# Patient Record
Sex: Male | Born: 1950
Health system: Southern US, Community
[De-identification: ages and names within clinical notes are randomized; demographics above are authoritative.]

## PROBLEM LIST (undated history)

## (undated) DIAGNOSIS — I480 Paroxysmal atrial fibrillation: Secondary | ICD-10-CM

## (undated) DIAGNOSIS — E785 Hyperlipidemia, unspecified: Secondary | ICD-10-CM

## (undated) DIAGNOSIS — I10 Essential (primary) hypertension: Secondary | ICD-10-CM

## (undated) DIAGNOSIS — I639 Cerebral infarction, unspecified: Secondary | ICD-10-CM

## (undated) DIAGNOSIS — E039 Hypothyroidism, unspecified: Secondary | ICD-10-CM

## (undated) HISTORY — PX: EYE SURGERY: SHX253

---

## 2015-11-14 ENCOUNTER — Encounter (HOSPITAL_BASED_OUTPATIENT_CLINIC_OR_DEPARTMENT_OTHER): Payer: Self-pay | Admitting: *Deleted

## 2015-11-14 ENCOUNTER — Inpatient Hospital Stay (HOSPITAL_BASED_OUTPATIENT_CLINIC_OR_DEPARTMENT_OTHER)
Admission: EM | Admit: 2015-11-14 | Discharge: 2015-11-17 | DRG: 065 | Disposition: A | Discharge status: Home / Self Care | Payer: Medicare Other | Attending: Internal Medicine | Admitting: Internal Medicine

## 2015-11-14 ENCOUNTER — Emergency Department (HOSPITAL_BASED_OUTPATIENT_CLINIC_OR_DEPARTMENT_OTHER): Payer: Medicare Other

## 2015-11-14 ENCOUNTER — Inpatient Hospital Stay (HOSPITAL_COMMUNITY): Payer: Medicare Other

## 2015-11-14 DIAGNOSIS — E02 Subclinical iodine-deficiency hypothyroidism: Secondary | ICD-10-CM | POA: Diagnosis present

## 2015-11-14 DIAGNOSIS — I480 Paroxysmal atrial fibrillation: Secondary | ICD-10-CM | POA: Diagnosis present

## 2015-11-14 DIAGNOSIS — Z8241 Family history of sudden cardiac death: Secondary | ICD-10-CM | POA: Diagnosis not present

## 2015-11-14 DIAGNOSIS — Z800 Family history of malignant neoplasm of digestive organs: Secondary | ICD-10-CM

## 2015-11-14 DIAGNOSIS — I672 Cerebral atherosclerosis: Secondary | ICD-10-CM | POA: Diagnosis present

## 2015-11-14 DIAGNOSIS — E782 Mixed hyperlipidemia: Secondary | ICD-10-CM | POA: Diagnosis not present

## 2015-11-14 DIAGNOSIS — K449 Diaphragmatic hernia without obstruction or gangrene: Secondary | ICD-10-CM | POA: Diagnosis present

## 2015-11-14 DIAGNOSIS — Z23 Encounter for immunization: Secondary | ICD-10-CM | POA: Diagnosis not present

## 2015-11-14 DIAGNOSIS — Z79899 Other long term (current) drug therapy: Secondary | ICD-10-CM | POA: Diagnosis not present

## 2015-11-14 DIAGNOSIS — Z8249 Family history of ischemic heart disease and other diseases of the circulatory system: Secondary | ICD-10-CM

## 2015-11-14 DIAGNOSIS — I6340 Cerebral infarction due to embolism of unspecified cerebral artery: Secondary | ICD-10-CM | POA: Diagnosis not present

## 2015-11-14 DIAGNOSIS — R29702 NIHSS score 2: Secondary | ICD-10-CM | POA: Diagnosis present

## 2015-11-14 DIAGNOSIS — G8194 Hemiplegia, unspecified affecting left nondominant side: Secondary | ICD-10-CM | POA: Diagnosis present

## 2015-11-14 DIAGNOSIS — I639 Cerebral infarction, unspecified: Secondary | ICD-10-CM

## 2015-11-14 DIAGNOSIS — R29810 Facial weakness: Secondary | ICD-10-CM | POA: Diagnosis present

## 2015-11-14 DIAGNOSIS — R269 Unspecified abnormalities of gait and mobility: Secondary | ICD-10-CM | POA: Diagnosis not present

## 2015-11-14 DIAGNOSIS — I6789 Other cerebrovascular disease: Secondary | ICD-10-CM | POA: Diagnosis not present

## 2015-11-14 DIAGNOSIS — I10 Essential (primary) hypertension: Secondary | ICD-10-CM | POA: Diagnosis present

## 2015-11-14 DIAGNOSIS — E785 Hyperlipidemia, unspecified: Secondary | ICD-10-CM | POA: Diagnosis not present

## 2015-11-14 DIAGNOSIS — Z8673 Personal history of transient ischemic attack (TIA), and cerebral infarction without residual deficits: Secondary | ICD-10-CM | POA: Diagnosis present

## 2015-11-14 DIAGNOSIS — I4891 Unspecified atrial fibrillation: Secondary | ICD-10-CM | POA: Diagnosis not present

## 2015-11-14 DIAGNOSIS — R471 Dysarthria and anarthria: Secondary | ICD-10-CM | POA: Diagnosis present

## 2015-11-14 HISTORY — DX: Paroxysmal atrial fibrillation: I48.0

## 2015-11-14 HISTORY — DX: Hyperlipidemia, unspecified: E78.5

## 2015-11-14 HISTORY — DX: Essential (primary) hypertension: I10

## 2015-11-14 HISTORY — DX: Cerebral infarction, unspecified: I63.9

## 2015-11-14 LAB — COMPREHENSIVE METABOLIC PANEL
ALT: 31 U/L (ref 17–63)
ANION GAP: 10 (ref 5–15)
AST: 27 U/L (ref 15–41)
Albumin: 5 g/dL (ref 3.5–5.0)
Alkaline Phosphatase: 60 U/L (ref 38–126)
BILIRUBIN TOTAL: 1.4 mg/dL — AB (ref 0.3–1.2)
BUN: 19 mg/dL (ref 6–20)
CO2: 24 mmol/L (ref 22–32)
Calcium: 9.6 mg/dL (ref 8.9–10.3)
Chloride: 105 mmol/L (ref 101–111)
Creatinine, Ser: 1.08 mg/dL (ref 0.61–1.24)
Glucose, Bld: 133 mg/dL — ABNORMAL HIGH (ref 65–99)
POTASSIUM: 3.9 mmol/L (ref 3.5–5.1)
Sodium: 139 mmol/L (ref 135–145)
TOTAL PROTEIN: 8.4 g/dL — AB (ref 6.5–8.1)

## 2015-11-14 LAB — CBC
HEMATOCRIT: 51.3 % (ref 39.0–52.0)
Hemoglobin: 17.8 g/dL — ABNORMAL HIGH (ref 13.0–17.0)
MCH: 30.2 pg (ref 26.0–34.0)
MCHC: 34.7 g/dL (ref 30.0–36.0)
MCV: 86.9 fL (ref 78.0–100.0)
Platelets: 201 10*3/uL (ref 150–400)
RBC: 5.9 MIL/uL — ABNORMAL HIGH (ref 4.22–5.81)
RDW: 13.6 % (ref 11.5–15.5)
WBC: 10.9 10*3/uL — ABNORMAL HIGH (ref 4.0–10.5)

## 2015-11-14 LAB — GLUCOSE, CAPILLARY: Glucose-Capillary: 161 mg/dL — ABNORMAL HIGH (ref 65–99)

## 2015-11-14 LAB — TROPONIN I

## 2015-11-14 MED ORDER — DILTIAZEM HCL-DEXTROSE 100-5 MG/100ML-% IV SOLN (PREMIX)
INTRAVENOUS | Status: AC
  Administered 2015-11-14: 10 mg via INTRAVENOUS
  Filled 2015-11-14: qty 100

## 2015-11-14 MED ORDER — DILTIAZEM HCL 30 MG PO TABS
30.0000 mg | ORAL_TABLET | Freq: Once | ORAL | Status: AC
  Administered 2015-11-14: 30 mg via ORAL
  Filled 2015-11-14: qty 1

## 2015-11-14 MED ORDER — ASPIRIN 81 MG PO CHEW
324.0000 mg | CHEWABLE_TABLET | Freq: Once | ORAL | Status: AC
  Administered 2015-11-14: 324 mg via ORAL
  Filled 2015-11-14: qty 4

## 2015-11-14 MED ORDER — ASPIRIN 300 MG RE SUPP: 300.0000 mg | Freq: Every day | RECTAL | Status: DC

## 2015-11-14 MED ORDER — DILTIAZEM HCL 25 MG/5ML IV SOLN
15.0000 mg | Freq: Once | INTRAVENOUS | Status: AC
  Administered 2015-11-14: 15 mg via INTRAVENOUS
  Filled 2015-11-14: qty 5

## 2015-11-14 MED ORDER — STROKE: EARLY STAGES OF RECOVERY BOOK
Freq: Once | Status: AC
  Administered 2015-11-14: 21:00:00
  Filled 2015-11-14: qty 1

## 2015-11-14 MED ORDER — HYDRALAZINE HCL 20 MG/ML IJ SOLN
5.0000 mg | Freq: Once | INTRAMUSCULAR | Status: AC
  Administered 2015-11-14: 5 mg via INTRAVENOUS
  Filled 2015-11-14: qty 1

## 2015-11-14 MED ORDER — ENOXAPARIN SODIUM 40 MG/0.4ML ~~LOC~~ SOLN
40.0000 mg | SUBCUTANEOUS | Status: DC
  Administered 2015-11-14: 40 mg via SUBCUTANEOUS
  Filled 2015-11-14: qty 0.4

## 2015-11-14 MED ORDER — DILTIAZEM LOAD VIA INFUSION
10.0000 mg | Freq: Once | INTRAVENOUS | Status: AC
  Administered 2015-11-14: 10 mg via INTRAVENOUS
  Filled 2015-11-14: qty 10

## 2015-11-14 MED ORDER — DILTIAZEM HCL 100 MG IV SOLR
5.0000 mg/h | INTRAVENOUS | Status: DC
  Administered 2015-11-14: 5 mg/h via INTRAVENOUS

## 2015-11-14 MED ORDER — DILTIAZEM HCL 25 MG/5ML IV SOLN
10.0000 mg | Freq: Once | INTRAVENOUS | Status: AC
  Administered 2015-11-14: 10 mg via INTRAVENOUS
  Filled 2015-11-14: qty 5

## 2015-11-14 MED ORDER — METOPROLOL TARTRATE 25 MG PO TABS
25.0000 mg | ORAL_TABLET | Freq: Two times a day (BID) | ORAL | Status: DC
  Administered 2015-11-14: 25 mg via ORAL
  Filled 2015-11-14: qty 1

## 2015-11-14 MED ORDER — ASPIRIN 325 MG PO TABS
325.0000 mg | ORAL_TABLET | Freq: Every day | ORAL | Status: DC
  Administered 2015-11-15: 325 mg via ORAL
  Filled 2015-11-14: qty 1

## 2015-11-14 MED ORDER — METOPROLOL TARTRATE 5 MG/5ML IV SOLN
2.5000 mg | INTRAVENOUS | Status: AC
  Filled 2015-11-14: qty 5

## 2015-11-14 MED ORDER — SENNOSIDES-DOCUSATE SODIUM 8.6-50 MG PO TABS: 1.0000 | ORAL_TABLET | Freq: Every evening | ORAL | Status: DC | PRN

## 2015-11-14 NOTE — ED Notes (Signed)
Per MD discontinue cardizem

## 2015-11-14 NOTE — Progress Notes (Signed)
Patient arrived to 5M18 AAOx4. Tele placed and vitals taken. MD in the room. Will continue to monitor. Cassidie Veiga, Rande Brunt, RN

## 2015-11-14 NOTE — ED Notes (Signed)
Attempted to call report to floor-unable to take call at present.  States they will call back

## 2015-11-14 NOTE — ED Notes (Signed)
MD at bedside. 

## 2015-11-14 NOTE — ED Notes (Signed)
Patient noted to be back in a. Fib with a rate of 120-150.  Dr. Billy Fischer made aware.

## 2015-11-14 NOTE — ED Triage Notes (Addendum)
Pt c/o left sided weakness, left side facial droop, dizziness and unsteady gait x 4 days.

## 2015-11-14 NOTE — ED Notes (Signed)
Spoke with Robin in bed control. States pt is waiting for neuro-tele bed to become available but should be placed this afternoon

## 2015-11-14 NOTE — Consult Note (Signed)
Neurology Consultation Reason for Consult: Left-sided weakness Referring Physician: Loleta Books, C  CC: Left-sided weakness  History is obtained from: Patient  HPI: Christian Jennings is a 65 y.o. male history of left-sided weakness that isn't present for 4 days. He states that he was playing tennis, and went to use his left hand to throw up a serve and found that it wasn't working quite right. He then noticed over the course of the past few days that things still seemed to be not quite right including with minimum year that he may be his left face isn't quite normal. He therefore presented to [facility] today. While at [facility] [place], he was found to be in atrial fibrillation with rapid ventricular response, and appears to have flipped in and out. He does not notice his heart racing or any other symptoms with this, so it is unclear how long he has had it.     ROS: A 14 point ROS was performed and is negative except as noted in the HPI.   History reviewed. No pertinent past medical history.   Family History  Problem Relation Age of Onset  . Transient ischemic attack Mother   . Alcoholism Father   . COPD Father   . Throat cancer Brother     Or esophageal     Social History:  reports that he has never smoked. He has never used smokeless tobacco. He reports that he does not drink alcohol. His drug history is not on file.   Exam: Current vital signs: BP (!) 142/99 (BP Location: Right Arm)   Pulse 78   Temp 97.8 F (36.6 C) (Oral)   Resp 18   Ht 5\' 5"  (1.651 m)   Wt 64.4 kg (142 lb)   SpO2 98%   BMI 23.63 kg/m  Vital signs in last 24 hours: Temp:  [97.8 F (36.6 C)-98.2 F (36.8 C)] 97.8 F (36.6 C) (09/29 2230) Pulse Rate:  [41-157] 78 (09/29 2230) Resp:  [13-22] 18 (09/29 2230) BP: (142-202)/(72-143) 142/99 (09/29 2230) SpO2:  [95 %-100 %] 98 % (09/29 2230) Weight:  [64.4 kg (142 lb)] 64.4 kg (142 lb) (09/29 1016)   Physical Exam  Constitutional:  Appears well-developed and well-nourished.  Psych: Affect appropriate to situation Eyes: No scleral injection HENT: No OP obstrucion Head: Normocephalic.  Cardiovascular: Normal rate and regular rhythm.  Respiratory: Effort normal and breath sounds normal to anterior ascultation GI: Soft.  No distension. There is no tenderness.  Skin: WDI  Neuro: Mental Status: Patient is awake, alert, oriented to person, place, month, year, and situation. Patient is able to give a clear and coherent history. No signs of aphasia or neglect Cranial Nerves: II: Visual Fields are full. Pupils are equal, round, and reactive to light.   III,IV, VI: EOMI without diploplia.  V: Facial sensation is symmetric to temperature VII: Facial movement is noticeable for mild left-sided weakness VIII: hearing is intact to voice X: Uvula elevates symmetrically XI: Shoulder shrug is symmetric. XII: tongue is midline without atrophy or fasciculations.  Motor: Tone is normal. Bulk is normal. 5/5 strength was present on the right, he has 4/5 strength in his left arm and 4+/5 strength in his left leg. Sensory: Sensation is symmetric to light touch and temperature in the arms and legs. Cerebellar: FNF and HKS are intact on the right, consistent with weakness on the left  I have reviewed labs in epic and the results pertinent to this consultation are: CMP-unremarkable  I have reviewed the images obtained: CT head-unremarkable  Impression: 65 year old male with acute stroke, likely due to atrial fibrillation. I suspect it is going to be small since it is not clearly well seen on CT despite being 60 days old. He being 34 days old. He will need further workup in physical therapy.  Recommendations: 1. HgbA1c, fasting lipid panel 2. MRI, MRA  of the brain without contrast 3. Frequent neuro checks 4. Echocardiogram 5. Carotid dopplers 6. Prophylactic therapy-Antiplatelet med: Aspirin - dose 325mg  PO or 300mg  PR 7. Risk  factor modification 8. Telemetry monitoring 9. PT consult, OT consult, Speech consult 10. please page stroke NP  Or  PA  Or MD  from 8am -4 pm starting 9/30 as this patient will be followed by the stroke team at this point.   You can look them up on [email or web address]      Roland Rack, MD [facility] [phone]  If 7pm- 7am, please page neurology on call as listed in [place].

## 2015-11-14 NOTE — ED Notes (Signed)
Called floor again-Report given to [place], Therapist, sports.

## 2015-11-14 NOTE — ED Notes (Signed)
Attempted to call floor again for report-states nurse unable to take report.  Informed floor that carelink is en route.  Verbalized understanding.  States they will call back.

## 2015-11-14 NOTE — ED Provider Notes (Addendum)
[facility] Provider Note   CSN: [id number] Arrival date & time: 11/14/15  1006     History   Chief Complaint Chief Complaint  Patient presents with  . Weakness    HPI Christian Jennings is a 65 y.o. male.  Patient is a 65 year old male with no past medical history and reports not having seen a physician in nearly 20 years. He presents today for evaluation of left facial droop and left arm and leg weakness for the past 3 days. He reports trouble with coordination and feeling off balance. He denies any headache or visual disturbances. He denies any chest pain or palpitations. He denies any shortness of breath. There are no aggravating or alleviating factors.      History reviewed. No pertinent past medical history.  There are no active problems to display for this patient.   Past Surgical History:  Procedure Laterality Date  . EYE SURGERY         Home Medications    Prior to Admission medications   Medication Sig Start Date End Date Taking? Authorizing Provider  NATTOKINASE PO Take by mouth.   Yes Historical Provider, MD    Family History History reviewed. No pertinent family history.  Social History Social History  Substance Use Topics  . Smoking status: Never Smoker  . Smokeless tobacco: Not on file  . Alcohol use No     Allergies   Review of patient's allergies indicates no known allergies.   Review of Systems Review of Systems  All other systems reviewed and are negative.    Physical Exam Updated Vital Signs BP 150/74   Pulse 84   Temp 98.2 F (36.8 C) (Oral)   Resp 18   Ht 5\' 5"  (1.651 m)   Wt 142 lb (64.4 kg)   SpO2 100%   BMI 23.63 kg/m   Physical Exam  Constitutional: He is oriented to person, place, and time. He appears well-developed and well-nourished. No distress.  HENT:  Head: Normocephalic and atraumatic.  Mouth/Throat: Oropharynx is clear and moist.  Eyes: EOM are normal. Pupils are equal, round, and reactive  to light.  Neck: Normal range of motion. Neck supple.  Cardiovascular: Exam reveals no friction rub.   No murmur heard. Heart is irregularly irregular and rapid.  Pulmonary/Chest: Effort normal and breath sounds normal. No respiratory distress. He has no wheezes. He has no rales.  Abdominal: Soft. Bowel sounds are normal. He exhibits no distension. There is no tenderness.  Musculoskeletal: Normal range of motion. He exhibits no edema.  Neurological: He is alert and oriented to person, place, and time. A cranial nerve deficit is present. Coordination normal.  There is a left-sided facial droop noted. Strength is 5 out of 5 in all extremities.  Skin: Skin is warm and dry. He is not diaphoretic.  Nursing note and vitals reviewed.    ED Treatments / Results  Labs (all labs ordered are listed, but only abnormal results are displayed) Labs Reviewed  TROPONIN I  CBC  COMPREHENSIVE METABOLIC PANEL    EKG  EKG Interpretation  Date/Time:  Friday November 14 2015 10:30:54 EDT Ventricular Rate:  175 PR Interval:    QRS Duration: 92 QT Interval:  284 QTC Calculation: 485 R Axis:   -73 Text Interpretation:  Atrial fibrillation with rapid V-rate Markedly posterior QRS axis Probable LVH with secondary repol abnrm ST depression, probably rate related Confirmed by Danzig Macgregor  MD, Mickell Birdwell ([id number]) on 11/14/2015 10:40:49 AM  Radiology No results found.  Procedures Procedures (including critical care time)  Medications Ordered in ED Medications - No data to display   Initial Impression / Assessment and Plan / ED Course  I have reviewed the triage vital signs and the nursing notes.  Pertinent labs & imaging results that were available during my care of the patient were reviewed by me and considered in my medical decision making (see chart for details).  Clinical Course    Patient is a 65 year old male with no past medical history, and who has not seen a physician in many years. He  presents for evaluation of left facial droop along with weakness and loss of coordination of his left arm and left leg. This started 3 days ago. While in the emergency department, he developed atrial fibrillation with rapid ventricular response with a rate in the 170s. He was started on a Cardizem drip.  CT scan of the head is negative, however I highly suspect that this patient has experienced a stroke. I've discussed the case with Dr. Shon Hale from neurology who is recommending admission to the hospitalist service. Patient accepted in transfer by Dr. Denton Brick. The patient will require further workup including MRI and likely study of his carotids and echocardiogram.  CRITICAL CARE Performed by: Veryl Speak Total critical care time: 45 minutes Critical care time was exclusive of separately billable procedures and treating other patients. Critical care was necessary to treat or prevent imminent or life-threatening deterioration. Critical care was time spent personally by me on the following activities: development of treatment plan with patient and/or surrogate as well as nursing, discussions with consultants, evaluation of patient's response to treatment, examination of patient, obtaining history from patient or surrogate, ordering and performing treatments and interventions, ordering and review of laboratory studies, ordering and review of radiographic studies, pulse oximetry and re-evaluation of patient's condition.   Final Clinical Impressions(s) / ED Diagnoses   Final diagnoses:  None    New Prescriptions New Prescriptions   No medications on file     Veryl Speak, MD 11/15/15 0700    Veryl Speak, MD 11/15/15 0700

## 2015-11-14 NOTE — H&P (Signed)
History and Physical  Patient Name: Christian Jennings     [id number]    DOB: 1950-09-01    DOA: 11/14/2015 PCP: No PCP Per Patient   Patient coming from: Home     Chief Complaint: Left sided weakness  HPI: Christian Jennings is a 65 y.o. male with no known past medical history who presents with left sided weakness.  Patient hasn't seen a physician in over 20 years (the last time was for a URI).  Three days before presentation on Tuesday, the patient was playing tennis, and realized that he couldn't throw the ball up as high as he usually could with his left hand when serving. Nonetheless he finished practicing tennis and went home without further problems.   Wednesday he woke up and noticed some dizziness, similar to previous episodes of vertigo but no chest discomfort, chest pain, shortness of breath, syncope, exertional dyspnea.  He also continued to have left-sided weakness (couldn't shampoo his hair as well with his left hand, left leg felt clumsier).  He spent the day thinking it was a pinched nerve or BPPV and arranged to see a chiropractor next week.  Thursday night last night he just couldn't sleep all night, and finally today the left sided clumsiness still was present so he went to UC who referred him to the ER.  ED course: -Afebrile, heart rate 70s to 170s, blood pressure 160/103, pulse oximetry and respirations normal -Na 139, K 3.9, Cr 1.08 (baseline unknown), WBC 10.9K, Hgb 17.8 -Troponin negative, ECG showed atrial fibrillation with RVR -CT head without contrast unremarkable -He was given diltiazem as a bolus and TRH were asked to evaluate in transfer and Neurology were notified     Review of systems:  Review of Systems  HENT: Negative for tinnitus.   Eyes: Negative for blurred vision and double vision.  Respiratory: Negative for shortness of breath.   Cardiovascular: Positive for palpitations (maybe?  "flutter in my belly sometimes"). Negative for chest pain, orthopnea  and leg swelling.  Musculoskeletal: Negative for back pain and neck pain.  Neurological: Positive for dizziness and focal weakness. Negative for tingling, tremors, sensory change, speech change, seizures, loss of consciousness and headaches.  All other systems reviewed and are negative.        History reviewed. No pertinent past medical history.  Past Surgical History:  Procedure Laterality Date  . EYE SURGERY     As child for muscle issue  . EYE SURGERY     Cosmetic for drooping eyelid    Social History: Patient lives with his wife.  Patient walks unassisted.  He is physically active.  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  He is not a smoker does not drink alcohol.  From [place] originally, lived here for many years.  No Known Allergies  Family history: family history includes Alcoholism in his father; COPD in his father; Throat cancer in his brother; Transient ischemic attack in his mother.   Prior to Admission medications   Medication Sig Start Date End Date Taking? Authorizing Provider  ALPHA LIPOIC ACID PO Take by mouth.   Yes Historical Provider, MD  CINNAMON PO Take by mouth.   Yes Historical Provider, MD  co-enzyme Q-10 30 MG capsule Take 30 mg by mouth 3 (three) times daily.   Yes Historical Provider, MD  L-ARGININE PO Take by mouth.   Yes Historical Provider, MD  Lecithin GRAN by Does not apply  route.   Yes Historical Provider, MD  NATTOKINASE PO Take by mouth.   Yes Historical Provider, MD  omega-3 acid ethyl esters (LOVAZA) 1 g capsule Take by mouth 2 (two) times daily.   Yes Historical Provider, MD  PHYTOSTEROLS PO Take by mouth.   Yes Historical Provider, MD       Physical Exam: BP (!) 176/115 (BP Location: Right Arm)   Pulse (!) 157   Temp 98.2 F (36.8 C)   Resp 15   Ht 5\' 5"  (1.651 m)   Wt 64.4 kg (142 lb)   SpO2 100%   BMI 23.63 kg/m  General appearance: Well-developed, adult male,  alert and in no acute distress.   Eyes: Anicteric, conjunctiva pink, lids and lashes normal. PERRL.   The left eye lid droops, he states this is chronic. ENT: No nasal deformity, discharge, epistaxis.  Hearing normal. OP moist without lesions.   Dentition normal. Lymph: No cervical, supraclavicular or axillary lymphadenopathy. Skin: Warm and dry.  No jaundice.  No suspicious rashes or lesions. Cardiac: Fast, irregularly irregular, nl S1-S2, no murmurs appreciated.  Capillary refill is brisk.  JVP normal.  No LE edema.  Radial and DP pulses 2+ and symmetric.  No carotid bruits. Respiratory: Normal respiratory rate and rhythm.  CTAB without rales or wheezes. GI: Abdomen soft without rigidity.  No TTP. No ascites, distension, no hepatosplenomegaly.   MSK: No deformities or effusions. Neuro: Pupils are 4 mm and reactive to 3 mm. Extraocular movements are intact, without nystagmus. Cranial nerve 5 is within normal limits. Cranial nerve 7 appears to have left sided droop. Cranial nerve 8 is within normal limits. Cranial nerves 9 and 10 reveal equal palate elevation. Cranial nerve 11 reveals sternocleidomastoid strong. Cranial nerve 12 is midline. Left grip, upper arm strength 4+/5, right strength 5/5.  Likewise, left hip flexion and knee extension 4+/5, and right 5/5. Finger-to-nose testing is abnormal on left because of weakness. Speech is fluent. Naming is grossly intact. Attention span and concentration are within normal limits.  Patellar reflexes normal.  Sensation intact to light touch and pinprick bilaterally. Psych: The patient is oriented to time, place and person. Behavior appropriate.  Affect normal.  Recall, recent and remote, as well as general fund of knowledge seem within normal limits. No evidence of aural or visual hallucinations or delusions.       Labs on Admission:  I have personally reviewed following labs and imaging studies: CBC:  Recent Labs Lab 11/14/15 1029  WBC  10.9*  HGB 17.8*  HCT 51.3  MCV 86.9  PLT 123456   Basic Metabolic Panel:  Recent Labs Lab 11/14/15 1029  NA 139  K 3.9  CL 105  CO2 24  GLUCOSE 133*  BUN 19  CREATININE 1.08  CALCIUM 9.6   GFR: Estimated Creatinine Clearance: 59.3 mL/min (by C-G formula based on SCr of 1.08 mg/dL). Liver Function Tests:  Recent Labs Lab 11/14/15 1029  AST 27  ALT 31  ALKPHOS 60  BILITOT 1.4*  PROT 8.4*  ALBUMIN 5.0   No results for input(s): LIPASE, AMYLASE in the last 168 hours. No results for input(s): AMMONIA in the last 168 hours. Coagulation Profile: No results for input(s): INR, PROTIME in the last 168 hours. Cardiac Enzymes:  Recent Labs Lab 11/14/15 1029  TROPONINI <0.03   BNP (last 3 results) No results for input(s): PROBNP in the last 8760 hours. HbA1C: No results for input(s): HGBA1C in the last 72 hours. CBG: No results  for input(s): GLUCAP in the last 168 hours. Lipid Profile: No results for input(s): CHOL, HDL, LDLCALC, TRIG, CHOLHDL, LDLDIRECT in the last 72 hours. Thyroid Function Tests: No results for input(s): TSH, T4TOTAL, FREET4, T3FREE, THYROIDAB in the last 72 hours. Anemia Panel: No results for input(s): VITAMINB12, FOLATE, FERRITIN, TIBC, IRON, RETICCTPCT in the last 72 hours. Sepsis Labs: Invalid input(s): PROCALCITONIN, LACTICIDVEN No results found for this or any previous visit (from the past 240 hour(s)).    Radiological Exams on Admission: Personally reviewed: Ct Head Wo Contrast  Result Date: 11/14/2015 CLINICAL DATA:  Left facial droop, left arm weakness and unsteady gait for 4 days. EXAM: CT HEAD WITHOUT CONTRAST TECHNIQUE: Contiguous axial images were obtained from the base of the skull through the vertex without intravenous contrast. COMPARISON:  None. FINDINGS: Brain: Mild appearing chronic microvascular ischemic change is seen. No evidence of acute abnormality including infarct, hemorrhage, mass lesion, mass effect the core, midline  shift or abnormal extra-axial fluid collection. No hydrocephalus or pneumocephalus. Vascular: Atherosclerotic vascular disease is identified. Skull: Intact. Sinuses/Orbits: Unremarkable. Other: None. IMPRESSION: No acute abnormality. Mild appearing chronic microvascular ischemic change. Atherosclerosis. Electronically Signed   By: Inge Rise M.D.   On: 11/14/2015 11:18     EKG: Independently reviewed. Rate 170s, atrial fibrilation.  Some lateral slight ST deprssions, upsloping, likely rate related.    Assessment/Plan 1. Acute Stroke:  This is new.  MRI pending.  Presumed Afib related. -Admit to telemetry -Neuro checks, NIHSS per protocol -Daily aspirin 325 mg -Permissive hypertension for now -Lipids, hemoglobin A1c ordered (note, patient reluctant to consider statin, given liver tox, prefers natural agents) -Carotid doppler, MRA ordered -Echocardiogram ordered -PT/OT consultation -Consult to Neurology, appreciate recommendations    2. Atrial fibrillation:  This is new.  CHADS2Vasc likely 24 (age, HTN, CVA).  Previously on nattokinase as "blood thinner" or perhaps as aspirin substitute.   -Monitor on telemetry -Metoprolol IV now to control rate, then metoprolol 25 mg BID PO -Defer AC until MRI completed -CM consult for coverage of NOAC  3. HTN:  No previous history of HTN -Permissive hypertension for now other than rate control agent above        DVT prophylaxis: Lovenox  Code Status: FULL  Family Communication: Wife at bedside.  All questions were answered.  Disposition Plan: Anticipate Stroke work up as above and consult to ancillary services.  Expect discharge within 2-3 days. Consults called: Neurology, Dr. Leonel Ramsay will see the patient. Admission status: Telemetry, INPATIENT status  Core measures: -VTE prophylaxis ordered at time of admission -Aspirin ordered at admission -Atrial fibrillation: new, present, and AC discussed, deferred at admission until  MRI -tPA not given because of outside the stroke window, symptoms present 3 days -Dysphagia screen passed in ER -Lipids ordered -PT eval ordered     Medical decision making: Patient seen at 8:00 PM on 11/14/2015.  Clinical condition: stable.       Edwin Dada [facility] Pager [phone]

## 2015-11-15 ENCOUNTER — Inpatient Hospital Stay (HOSPITAL_COMMUNITY): Payer: Medicare Other

## 2015-11-15 DIAGNOSIS — E785 Hyperlipidemia, unspecified: Secondary | ICD-10-CM

## 2015-11-15 DIAGNOSIS — I639 Cerebral infarction, unspecified: Secondary | ICD-10-CM

## 2015-11-15 LAB — GLUCOSE, CAPILLARY
GLUCOSE-CAPILLARY: 123 mg/dL — AB (ref 65–99)
GLUCOSE-CAPILLARY: 105 mg/dL — AB (ref 65–99)
Glucose-Capillary: 109 mg/dL — ABNORMAL HIGH (ref 65–99)
Glucose-Capillary: 100 mg/dL — ABNORMAL HIGH (ref 65–99)

## 2015-11-15 LAB — COMPREHENSIVE METABOLIC PANEL
ALK PHOS: 53 U/L (ref 38–126)
ALT: 28 U/L (ref 17–63)
AST: 26 U/L (ref 15–41)
Albumin: 4.4 g/dL (ref 3.5–5.0)
Anion gap: 10 (ref 5–15)
BILIRUBIN TOTAL: 1.5 mg/dL — AB (ref 0.3–1.2)
BUN: 16 mg/dL (ref 6–20)
CALCIUM: 9.5 mg/dL (ref 8.9–10.3)
CO2: 24 mmol/L (ref 22–32)
CREATININE: 1 mg/dL (ref 0.61–1.24)
Chloride: 103 mmol/L (ref 101–111)
Glucose, Bld: 88 mg/dL (ref 65–99)
Potassium: 3.5 mmol/L (ref 3.5–5.1)
SODIUM: 137 mmol/L (ref 135–145)
Total Protein: 7.1 g/dL (ref 6.5–8.1)

## 2015-11-15 LAB — RAPID URINE DRUG SCREEN, HOSP PERFORMED
Amphetamines: NOT DETECTED
Barbiturates: NOT DETECTED
Benzodiazepines: NOT DETECTED
Cocaine: NOT DETECTED
OPIATES: NOT DETECTED
TETRAHYDROCANNABINOL: NOT DETECTED

## 2015-11-15 LAB — MAGNESIUM: Magnesium: 2.5 mg/dL — ABNORMAL HIGH (ref 1.7–2.4)

## 2015-11-15 LAB — LIPID PANEL
CHOLESTEROL: 291 mg/dL — AB (ref 0–200)
HDL: 56 mg/dL (ref >40)
LDL Cholesterol: 216 mg/dL — ABNORMAL HIGH (ref 0–99)
Total CHOL/HDL Ratio: 5.2 RATIO
Triglycerides: 96 mg/dL (ref <150)
VLDL: 19 mg/dL (ref 0–40)

## 2015-11-15 LAB — CBC WITH DIFFERENTIAL/PLATELET
Basophils Absolute: 0 10*3/uL (ref 0.0–0.1)
Basophils Relative: 0 %
Eosinophils Absolute: 0.2 10*3/uL (ref 0.0–0.7)
Eosinophils Relative: 2 %
HEMATOCRIT: 49.3 % (ref 39.0–52.0)
HEMOGLOBIN: 17 g/dL (ref 13.0–17.0)
LYMPHS ABS: 2.5 10*3/uL (ref 0.7–4.0)
LYMPHS PCT: 23 %
MCH: 30.9 pg (ref 26.0–34.0)
MCHC: 34.5 g/dL (ref 30.0–36.0)
MCV: 89.6 fL (ref 78.0–100.0)
Monocytes Absolute: 1.1 10*3/uL — ABNORMAL HIGH (ref 0.1–1.0)
Monocytes Relative: 10 %
NEUTROS ABS: 7.2 10*3/uL (ref 1.7–7.7)
NEUTROS PCT: 65 %
Platelets: 166 10*3/uL (ref 150–400)
RBC: 5.5 MIL/uL (ref 4.22–5.81)
RDW: 13.1 % (ref 11.5–15.5)
WBC: 11 10*3/uL — AB (ref 4.0–10.5)

## 2015-11-15 LAB — TSH: TSH: 6.379 u[IU]/mL — AB (ref 0.350–4.500)

## 2015-11-15 LAB — PHOSPHORUS: Phosphorus: 3.2 mg/dL (ref 2.5–4.6)

## 2015-11-15 LAB — T4, FREE: Free T4: 0.99 ng/dL (ref 0.61–1.12)

## 2015-11-15 MED ORDER — ZOLPIDEM TARTRATE 5 MG PO TABS
5.0000 mg | ORAL_TABLET | Freq: Once | ORAL | Status: AC
  Administered 2015-11-15: 5 mg via ORAL
  Filled 2015-11-15 (×2): qty 1

## 2015-11-15 MED ORDER — PNEUMOCOCCAL VAC POLYVALENT 25 MCG/0.5ML IJ INJ: 0.5000 mL | INJECTION | INTRAMUSCULAR | Status: DC

## 2015-11-15 MED ORDER — ATORVASTATIN CALCIUM 10 MG PO TABS: 20.0000 mg | ORAL_TABLET | Freq: Every day | ORAL | Status: DC

## 2015-11-15 MED ORDER — ATORVASTATIN CALCIUM 80 MG PO TABS
80.0000 mg | ORAL_TABLET | Freq: Every day | ORAL | Status: DC
  Administered 2015-11-15 – 2015-11-16 (×2): 80 mg via ORAL
  Filled 2015-11-15 (×2): qty 1

## 2015-11-15 MED ORDER — METOPROLOL TARTRATE 25 MG/10 ML ORAL SUSPENSION: 12.5000 mg | Freq: Two times a day (BID) | ORAL | Status: DC

## 2015-11-15 MED ORDER — INFLUENZA VAC SPLIT QUAD 0.5 ML IM SUSY
0.5000 mL | PREFILLED_SYRINGE | INTRAMUSCULAR | Status: AC
  Administered 2015-11-16: 0.5 mL via INTRAMUSCULAR
  Filled 2015-11-15: qty 0.5

## 2015-11-15 MED ORDER — APIXABAN 5 MG PO TABS
5.0000 mg | ORAL_TABLET | Freq: Two times a day (BID) | ORAL | Status: DC
  Administered 2015-11-15 – 2015-11-17 (×5): 5 mg via ORAL
  Filled 2015-11-15 (×5): qty 1

## 2015-11-15 MED ORDER — METOPROLOL TARTRATE 5 MG/5ML IV SOLN
2.5000 mg | Freq: Four times a day (QID) | INTRAVENOUS | Status: DC | PRN
  Administered 2015-11-15: 2.5 mg via INTRAVENOUS
  Filled 2015-11-15: qty 5

## 2015-11-15 MED ORDER — ATORVASTATIN CALCIUM 80 MG PO TABS: 80.0000 mg | ORAL_TABLET | Freq: Every day | ORAL | Status: DC

## 2015-11-15 MED ORDER — METOPROLOL TARTRATE 5 MG/5ML IV SOLN
2.5000 mg | Freq: Four times a day (QID) | INTRAVENOUS | Status: DC
  Administered 2015-11-15: 2.5 mg via INTRAVENOUS
  Filled 2015-11-15: qty 5

## 2015-11-15 MED ORDER — METOPROLOL TARTRATE 25 MG/10 ML ORAL SUSPENSION
25.0000 mg | Freq: Two times a day (BID) | ORAL | Status: DC
  Administered 2015-11-16: 25 mg via ORAL
  Filled 2015-11-15: qty 10

## 2015-11-15 NOTE — Progress Notes (Signed)
STROKE TEAM PROGRESS NOTE   HISTORY OF PRESENT ILLNESS (per record) Christian Jennings is a 65 y.o. male history of left-sided weakness that isn't present for 4 days. He states that he was playing tennis, and went to use his left hand to throw up a serve and found that it wasn't working quite right. He then noticed over the course of the past few days that things still seemed to be not quite right including with minimum year that he may be his left face isn't quite normal. He therefore presented to [facility] today. While at [facility] [place], he was found to be in atrial fibrillation with rapid ventricular response, and appears to have flipped in and out. He does not notice his heart racing or any other symptoms with this, so it is unclear how long he has had it.   SUBJECTIVE (INTERVAL HISTORY) His wife is at the bedside.  Overall he feels his condition is gradually improving. He still has mild left hemiparesis and left facial droop. A. fib RVR converted to normal sinus rhythm, on metoprolol now.    OBJECTIVE Temp:  [97.6 F (36.4 C)-98.7 F (37.1 C)] 97.6 F (36.4 C) (09/30 0750) Pulse Rate:  [41-157] 131 (09/30 0750) Cardiac Rhythm: Normal sinus rhythm (09/29 2056) Resp:  [13-22] 20 (09/30 0750) BP: (141-202)/(72-143) 181/120 (09/30 0750) SpO2:  [95 %-100 %] 98 % (09/30 0750) Weight:  [64.4 kg (142 lb)] 64.4 kg (142 lb) (09/29 1016)  CBC:  Recent Labs Lab 11/14/15 1029  WBC 10.9*  HGB 17.8*  HCT 51.3  MCV 86.9  PLT 123456    Basic Metabolic Panel:  Recent Labs Lab 11/14/15 1029  NA 139  K 3.9  CL 105  CO2 24  GLUCOSE 133*  BUN 19  CREATININE 1.08  CALCIUM 9.6    Lipid Panel: No results found for: CHOL, TRIG, HDL, CHOLHDL, VLDL, LDLCALC HgbA1c: No results found for: HGBA1C Urine Drug Screen: No results found for: LABOPIA, COCAINSCRNUR, LABBENZ, AMPHETMU, THCU, LABBARB    IMAGING I have personally reviewed the radiological images below and agree  with the radiology interpretations.  Ct Head Wo Contrast 11/14/2015 No acute abnormality. Mild appearing chronic microvascular ischemic change. Atherosclerosis.   Mr [name] Head/brain Wo Cm 11/15/2015 1. Acute/early subacute infarction and right mid corona radiata with foci extending into right lentiform nucleus, posterior limb of internal capsule, and caudate body. No hemorrhage identified.  2. No occlusion, aneurysm, or high-grade stenosis of the circle of Willis is identified. Areas of mild stenosis and vessel irregularity in the anterior and posterior circulation probably represents intracranial atherosclerosis.   CUS pending  TTE pending   PHYSICAL EXAM  Temp:  [97.6 F (36.4 C)-98.7 F (37.1 C)] 97.8 F (36.6 C) (09/30 0951) Pulse Rate:  [41-157] 85 (09/30 0951) Resp:  [13-20] 20 (09/30 0951) BP: (141-184)/(88-133) 159/97 (09/30 0951) SpO2:  [97 %-100 %] 99 % (09/30 0951)  General - Well nourished, well developed, in no apparent distress.  Ophthalmologic - Sharp disc margins OU.   Cardiovascular - Regular rate and rhythm.  Mental Status -  Level of arousal and orientation to time, place, and person were intact. Language including expression, naming, repetition, comprehension was assessed and found intact, mild dysarthria. Attention span and concentration were normal. Recent and remote memory were intact. Fund of Knowledge was assessed and was intact.  Cranial Nerves II - XII - II - Visual field intact OU. III, IV, VI - Extraocular movements intact.  V - Facial sensation intact bilaterally. VII - left facial droop. VIII - Hearing & vestibular intact bilaterally. X - Palate elevates symmetrically, mild dysarthria. XI - Chin turning & shoulder shrug intact bilaterally. XII - Tongue protrusion intact.  Motor Strength - The patient's strength was normal in all extremities except LUE 5-/5 deltoid and 4/5 bicep and tricep and finger grip with left pronator drift as well as  right foot 4/5 DF.  Bulk was normal and fasciculations were absent.   Motor Tone - Muscle tone was assessed at the neck and appendages and was normal.  Reflexes - The patient's reflexes were 1+ in all extremities and he had no pathological reflexes.  Sensory - Light touch, temperature/pinprick were assessed and were symmetrical.    Coordination - The patient had normal movements in the hands with no ataxia or dysmetria.  Tremor was absent.  Gait and Station -deferred.   ASSESSMENT/PLAN Mr. MAUI BOLOTIN is a 65 y.o. male with newly diagnosed atrial fibrillation presenting with left-sided weakness. He did not receive IV t-PA due to late presentation.  Stroke: Right BG/CR infarcts - embolic secondary to  newly diagnosed atrial fibrillation.  Resultant  left facial droop and mild left hemiparesis   MRI - Acute/early subacute infarction in the right mid CR and BG.  MRA - left MCA stenosis, bilateral ICA siphon and bilateral PCAs and left VA atherosclerosis   Carotid Doppler - pending  2D Echo - pending  LDL - 216  HgbA1c - pending  VTE prophylaxis - Eliquis  Diet Heart Room service appropriate? Yes; Fluid consistency: Thin  No antithrombotic prior to admission, now on Eliquis (apixaban) 5 mg twice a day.  Pt counseled to be compliant with his antithrombotic medications  Ongoing aggressive stroke risk factor management  Therapy recommendations: Outpatient PT recommended. OT evaluation pending.  Disposition:  Pending  Atrial fibrillation  Newly diagnosed  A. fib RVR converted to NSR  On metoprolol  On Eliquis 5 mg bid.   Hypertension  Blood pressure runs high at times.  Permissive hypertension (OK if <180/105) but gradually normalize in 5-7 days  Long-term BP goal normotensive  Hyperlipidemia  Home meds:  No lipid lowering medications prior to admission.  LDL 216, goal < 70  Put on Lipitor 80 mg daily  Continue statin at discharge  Other Stroke Risk  Factors  Advanced age  Family hx - Mother - TIA  Other Active Problems  Mild leukocytosis  Hospital day # 1  Rosalin Hawking, MD PhD Stroke Neurology 11/15/2015 2:48 PM    To contact Stroke Continuity provider, please refer to [email or web address] After hours, contact General Neurology

## 2015-11-15 NOTE — Progress Notes (Signed)
PROGRESS NOTE    Christian Jennings  [id number] DOB: Jul 13, 1950 DOA: 11/14/2015 PCP: No PCP Per Patient; Has not been to a Physician in 20 years.  Brief Narrative:  The patient is a very pleasant 65 yo Caucasian Male with a no real PMH except Left eye surgery who presented to [facility] with Left Sided weakness. He noticed symptoms starting on Tuesday when he was playing tennis and Wednesday he woke up and noticed some dizziness, similar to previous episodes of vertigo but no chest discomfort, chest pain, shortness of breath, syncope, exertional dyspnea.  He also continued to have left-sided weakness (couldn't shampoo his hair as well with his left hand, left leg felt clumsier). He presented to urgent care who ultimately referred him to the ER where he was worked up and underwent CT Scan without Contrast as well as MRI and MRA of Head without Contrast. Impression of MRI/MRA showed an acute/early subacute infarction and Right Mid Coronoa Radiata with Foci extending into the lentiform nucleus, posterior limb of internal capsule, and cuadate body with no hemorrhage. He was admitted and Neurology was consulted.   Assessment & Plan:   Principal Problem:   Acute CVA (cerebrovascular accident) ([place]) Active Problems:   Atrial fibrillation with RVR ([place])   Essential hypertension  ASSESSMENTS  1. Acute/Subacute Right Mid Corona Radiata CVA with Foci extending into Right Lentiform Nucleus, Posterior Limb of Internal Capsule, and Caudate body likely Embolic in Nature from Atrial Fibrillation with RVR. 2. Paroxysmal Atrial Fibrillation with RVR s/p conversion to NSR 3. Hypertension 4. Hyperlipidemia 5. Suspected Hypothyroidism  PLAN  1. Acute/Subacute Right Mid Corona Radiata CVA with Foci extending into Right Lentiform Nucleus, Posterior Limb of Internal Capsule, and Caudate body likely Embolic in Nature from Atrial Fibrillation with RVR. -Neurology Stroke Team Consulted and Appreciate  Recommendations. Discussed Case with Dr. Erlinda Hong -CT Head showed No acute abnormality. Mild appearing chronic microvascular ischemic change. -MRI/MRA of Head showed Acute/early subacute infarction and right mid corona radiata with foci extending into right lentiform nucleus, posterior limb of internal capsule, and caudate body. No hemorrhage identified.No occlusion, aneurysm, or high-grade stenosis of the circle of Willis is identified. Areas of mild stenosis and vessel irregularity in the anterior and posterior circulation probably representsintracranial atherosclerosis. -Carotid Dopplers pending to be done. -Transthoracic Echocardiogram ordered and pending to be done -Lipid Panel showed Cholesterol of 291, HDL of 56, LDL of 216, TG of 96 -HbA1c Pending -ASA 325 mg and Apixaban 5 mg po BID -PT/OT/SLP Evaluate and treat -Neurochecks per protocol -Continue with Telemetry -Allow for Permissive HTN  2. Paroxysmal Atrial Fibrillation with RVR, s/p spontaneous conversion to NSR -Previously on Cardizem gtt -Patient is on Telemetry now -CHADSVASC - 4 because of Age, HTN, and CVA -Will get ECHO -Initiated Anticoagulation with Apixiban 5 mg po BID -C/w Metoprolol 25 mg po BID   -C/w IV Metoprolol 2.5 mg IV q6hprn -Continue to Monitor   3. Hypertension -Allow for Permissive HTN. BP has been ranging from 141-202 SBP and 89-135 -Continue with Metoprolol 25 mg po BID -C/w IV Metoprolol 2.5 mg IV q6hprn -Will likely add Lisinopril 20 mg po Daily in AM  4. Hyperlipidemia -Lipid Panel showed Cholesterol of 291, HDL of 56, LDL of 216, TG of 96 -Patient was started on Atorvastatin 80 mg po qHS  5. Subclinical Hypothyroidism -TSH was 6.379; Obtaining Free T4 0.99  -Will consider 25 mcg of Levothyroxine  DVT prophylaxis: Anticoagulated with Apixaban Code Status: Full Family Communication: Discussed plan  of Care with Patients wife who is in agreement. All questions answered to her and patient's  satisfaction. Disposition Plan: Likely D/C Home with Home Health  Consultants:   Neurology  Procedures: None Antimicrobials: None  Subjective:  Patient was seen and examined at bedside and he was doing better. He was anxious about his HR being elevated and stated he had been tired from not getting very much sleep. Denied CP/SOB, N/V/ Lightheadness or Dizziness. Stated he was still a little weak on his Left side. No other complaints or concerns at this time.   Objective: Vitals:   11/15/15 0600 11/15/15 0750 11/15/15 0951 11/15/15 1548  BP: (!) 141/111 (!) 181/120 (!) 159/97 (!) 162/112  Pulse: 94 (!) 131 85 (!) 113  Resp: 18 20 20 20   Temp: 98.3 F (36.8 C) 97.6 F (36.4 C) 97.8 F (36.6 C) 98.1 F (36.7 C)  TempSrc: Oral Oral Oral Oral  SpO2: 97% 98% 99% 99%  Weight:      Height:        Intake/Output Summary (Last 24 hours) at 11/15/15 1720 Last data filed at 11/15/15 0300  Gross per 24 hour  Intake            29.92 ml  Output              300 ml  Net          -270.08 ml   Filed Weights   11/14/15 1016  Weight: 64.4 kg (142 lb)    Examination: Physical Exam:  Constitutional: Thin, NAD and appears calm and comfortable Eyes:  Left Lid Droop noted, sclerae anicteric  ENMT: External Ears, Nose appear normal. Grossly normal hearing. Mucous membranes appear moist. Normal dentition.  Neck: Appears normal, supple, no cervical masses, normal ROM, no appreciable thyromegaly Respiratory: Clear to auscultation bilaterally, no wheezing, rales, rhonchi or crackles. Normal respiratory effort and patient is not tachypenic. No accessory muscle use.  Cardiovascular: RRR, no murmurs / rubs / gallops. S1 and S2 auscultated. No extremity edema. 2+ pedal pulses.  Abdomen: Soft, non-tender, non-distended. No masses palpated. No appreciable hepatosplenomegaly. Bowel sounds positive x4.  GU: Deferred. Musculoskeletal: No clubbing / cyanosis of digits/nails. No joint deformity upper and  lower extremities. Left arm restricted motion and weaker strength in Left Upper Extremity. Normal muscle tone.  Skin: No rashes, lesions, ulcers. No induration; Warm and dry.  Neurologic: Left sided facial droop and Eyelid droop appreciated. Sensation intact in all 4 Extremities, DTR hyperreflexive on Left knee. Strength 4/5 in Left Upper Extremity. Romberg sign cerebellar reflexes not assessed.  Psychiatric: Normal judgment and insight. Alert and oriented x 3. Anxious mood and appropriate affect.   Data Reviewed: I have reviewed the following data.   CBC:  Recent Labs Lab 11/14/15 1029 11/15/15 0607  WBC 10.9* 11.0*  NEUTROABS  --  7.2  HGB 17.8* 17.0  HCT 51.3 49.3  MCV 86.9 89.6  PLT 201 [id number]   Basic Metabolic Panel:  Recent Labs Lab 11/14/15 1029 11/15/15 0607  NA 139 137  K 3.9 3.5  CL 105 103  CO2 24 24  GLUCOSE 133* 88  BUN 19 16  CREATININE 1.08 1.00  CALCIUM 9.6 9.5  MG  --  2.5*  PHOS  --  3.2   GFR: Estimated Creatinine Clearance: 64.1 mL/min (by C-G formula based on SCr of 1 mg/dL). Liver Function Tests:  Recent Labs Lab 11/14/15 1029 11/15/15 0607  AST 27 26  ALT 31 28  ALKPHOS  60 53  BILITOT 1.4* 1.5*  PROT 8.4* 7.1  ALBUMIN 5.0 4.4   No results for input(s): LIPASE, AMYLASE in the last 168 hours. No results for input(s): AMMONIA in the last 168 hours. Coagulation Profile: No results for input(s): INR, PROTIME in the last 168 hours. Cardiac Enzymes:  Recent Labs Lab 11/14/15 1029  TROPONINI <0.03   BNP (last 3 results) No results for input(s): PROBNP in the last 8760 hours. HbA1C: No results for input(s): HGBA1C in the last 72 hours. CBG:  Recent Labs Lab 11/14/15 2154 11/15/15 0640 11/15/15 1142 11/15/15 1612  GLUCAP 161* 109* 123* 105*   Lipid Profile:  Recent Labs  11/15/15 0607  CHOL 291*  HDL 56  LDLCALC 216*  TRIG 96  CHOLHDL 5.2   Thyroid Function Tests:  Recent Labs  11/15/15 0607 11/15/15 1209  TSH  6.379*  --   FREET4  --  0.99   Anemia Panel: No results for input(s): VITAMINB12, FOLATE, FERRITIN, TIBC, IRON, RETICCTPCT in the last 72 hours. Sepsis Labs: No results for input(s): PROCALCITON, LATICACIDVEN in the last 168 hours.  No results found for this or any previous visit (from the past 240 hour(s)).   Radiology Studies: Ct Head Wo Contrast  Result Date: 11/14/2015 CLINICAL DATA:  Left facial droop, left arm weakness and unsteady gait for 4 days. EXAM: CT HEAD WITHOUT CONTRAST TECHNIQUE: Contiguous axial images were obtained from the base of the skull through the vertex without intravenous contrast. COMPARISON:  None. FINDINGS: Brain: Mild appearing chronic microvascular ischemic change is seen. No evidence of acute abnormality including infarct, hemorrhage, mass lesion, mass effect the core, midline shift or abnormal extra-axial fluid collection. No hydrocephalus or pneumocephalus. Vascular: Atherosclerotic vascular disease is identified. Skull: Intact. Sinuses/Orbits: Unremarkable. Other: None. IMPRESSION: No acute abnormality. Mild appearing chronic microvascular ischemic change. Atherosclerosis. Electronically Signed   By: Inge Rise M.D.   On: 11/14/2015 11:18   Mr Brain Wo Contrast  Result Date: 11/15/2015 CLINICAL DATA:  65 y/o M; no known past medical history presenting with left-sided weakness. EXAM: MRI HEAD WITHOUT CONTRAST MRA HEAD WITHOUT CONTRAST TECHNIQUE: Multiplanar, multiecho pulse sequences of the brain and surrounding structures were obtained without intravenous contrast. Angiographic images of the head were obtained using MRA technique without contrast. COMPARISON:  11/14/2015 CT head. FINDINGS: MRI HEAD FINDINGS Brain: Focus of restricted diffusion within the right body of caudate and mid corona radiata with additional small foci in the right putamin and posterior limb of internal capsule mild associated T2 FLAIR hyperintensity consistent with acute/ early  subacute infarction. No additional diffusion abnormality is identified. Scattered nonspecific foci of T2 FLAIR hyperintensity in subcortical and periventricular white matter with frontal parietal predominance are consistent with mild chronic microvascular ischemic changes. No abnormal susceptibility hypointensity to indicate intracranial hemorrhage. No extra-axial collection. Vascular: See below. Skull and upper cervical spine: Normal marrow signal. Sinuses/Orbits: Negative. Other: None. MRA HEAD FINDINGS Bilateral internal carotid arteries, middle cerebral arteries, and anterior cerebral arteries are patent. Mild narrowing of proximal left M2 inferior division. No occlusion, aneurysm, or significant stenosis is identified. Left dominant vertebrobasilar system. Bilateral V4 segments, bilateral PICA, the basilar artery, bilateral AICA, bilateral SCA, and bilateral posterior cerebral arteries are patent. Mild irregularity of the left V4 segment and right proximal PCA probably reflects underlying atherosclerosis with mild associated stenosis. No occlusion, aneurysm, or significant stenosis is identified. Patent anterior communicating artery. Large right posterior communicating artery with small right P1 segment consistent with persistent fetal circulation.  No left posterior communicating artery identified, hypoplastic or absent. IMPRESSION: 1. Acute/early subacute infarction and right mid corona radiata with foci extending into right lentiform nucleus, posterior limb of internal capsule, and caudate body. No hemorrhage identified. 2. No occlusion, aneurysm, or high-grade stenosis of the circle of Willis is identified. Areas of mild stenosis and vessel irregularity in the anterior and posterior circulation probably represents intracranial atherosclerosis. These results will be called to the ordering clinician or representative by the Radiologist Assistant, and communication documented in the PACS or zVision Dashboard.  Electronically Signed   By: Kristine Garbe M.D.   On: 11/15/2015 00:38   Mr [name] Head/brain [id number] Cm  Result Date: 11/15/2015 CLINICAL DATA:  65 y/o M; no known past medical history presenting with left-sided weakness. EXAM: MRI HEAD WITHOUT CONTRAST MRA HEAD WITHOUT CONTRAST TECHNIQUE: Multiplanar, multiecho pulse sequences of the brain and surrounding structures were obtained without intravenous contrast. Angiographic images of the head were obtained using MRA technique without contrast. COMPARISON:  11/14/2015 CT head. FINDINGS: MRI HEAD FINDINGS Brain: Focus of restricted diffusion within the right body of caudate and mid corona radiata with additional small foci in the right putamin and posterior limb of internal capsule mild associated T2 FLAIR hyperintensity consistent with acute/ early subacute infarction. No additional diffusion abnormality is identified. Scattered nonspecific foci of T2 FLAIR hyperintensity in subcortical and periventricular white matter with frontal parietal predominance are consistent with mild chronic microvascular ischemic changes. No abnormal susceptibility hypointensity to indicate intracranial hemorrhage. No extra-axial collection. Vascular: See below. Skull and upper cervical spine: Normal marrow signal. Sinuses/Orbits: Negative. Other: None. MRA HEAD FINDINGS Bilateral internal carotid arteries, middle cerebral arteries, and anterior cerebral arteries are patent. Mild narrowing of proximal left M2 inferior division. No occlusion, aneurysm, or significant stenosis is identified. Left dominant vertebrobasilar system. Bilateral V4 segments, bilateral PICA, the basilar artery, bilateral AICA, bilateral SCA, and bilateral posterior cerebral arteries are patent. Mild irregularity of the left V4 segment and right proximal PCA probably reflects underlying atherosclerosis with mild associated stenosis. No occlusion, aneurysm, or significant stenosis is identified. Patent anterior  communicating artery. Large right posterior communicating artery with small right P1 segment consistent with persistent fetal circulation. No left posterior communicating artery identified, hypoplastic or absent. IMPRESSION: 1. Acute/early subacute infarction and right mid corona radiata with foci extending into right lentiform nucleus, posterior limb of internal capsule, and caudate body. No hemorrhage identified. 2. No occlusion, aneurysm, or high-grade stenosis of the circle of Willis is identified. Areas of mild stenosis and vessel irregularity in the anterior and posterior circulation probably represents intracranial atherosclerosis. These results will be called to the ordering clinician or representative by the Radiologist Assistant, and communication documented in the PACS or zVision Dashboard. Electronically Signed   By: Kristine Garbe M.D.   On: 11/15/2015 00:38   Scheduled Meds: . apixaban  5 mg Oral BID  . atorvastatin  80 mg Oral q1800  . [START ON 11/16/2015] Influenza vac split quadrivalent PF  0.5 mL Intramuscular Tomorrow-1000  . [START ON 11/16/2015] metoprolol tartrate  25 mg Oral BID  . [START ON 11/16/2015] pneumococcal 23 valent vaccine  0.5 mL Intramuscular Tomorrow-1000   Continuous Infusions:    LOS: 1 day    Kerney Elbe, DO [facility] Pager [phone]  If 7PM-7AM, please contact night-coverage [email or web address] Password [email or web address] 11/15/2015, 5:20 PM

## 2015-11-15 NOTE — Evaluation (Signed)
Physical Therapy Evaluation Patient Details Name: Christian Jennings MRN: [id number] DOB: [birth year] Today's Date: 11/15/2015   History of Present Illness  Admitted with left sided weakness. Dx of acute stroke likely from a-fib. MRI pending. no significant PMH.   Clinical Impression  Pt admitted with stroke and moderate to more mild deficits. Pt scored 18 on DGI balance test which does indicate risk for falls. Significant time taken to educate pt and wife on safety/falls risk and rehab following stroke.  Pts condition is evolving medically. Pt will benefit from ongoing PT in the acute setting and recommend outpatient PT once discharged.    Follow Up Recommendations Outpatient PT;Supervision for mobility/OOB    Equipment Recommendations  None recommended by PT    Recommendations for Other Services OT consult     Precautions / Restrictions Precautions Precautions: Fall Precaution Comments: Advised pt and wife that pt needs assistance from staff any time he gets up.        Mobility  Bed Mobility Overal bed mobility: Modified Independent             General bed mobility comments: abnormal movement pattern to get up  Transfers Overall transfer level: Needs assistance Equipment used: None Transfers: Sit to/from Stand Sit to Stand: Min guard            Ambulation/Gait Ambulation/Gait assistance: Min guard Ambulation Distance (Feet): 200 Feet Assistive device: None Gait Pattern/deviations: Step-through pattern   Gait velocity interpretation: >2.62 ft/sec, indicative of independent community ambulator General Gait Details: left foot occassionaly catches or drags on ground. abnormal mechanics of gait  Stairs Stairs: Yes Stairs assistance: Min guard Stair Management: One rail Left;Step to pattern;Forwards Number of Stairs: 3 (performed several times) General stair comments: instructed in safest technique  Wheelchair Mobility    Modified Rankin (Stroke Patients  Only) Modified Rankin (Stroke Patients Only) Pre-Morbid Rankin Score: No symptoms Modified Rankin: No significant disability     Balance Overall balance assessment: Needs assistance Sitting-balance support: Feet supported Sitting balance-Leahy Scale: Normal                           Standardized Balance Assessment Standardized Balance Assessment : Dynamic Gait Index   Dynamic Gait Index Level Surface: Mild Impairment Change in Gait Speed: Normal Gait with Horizontal Head Turns: Mild Impairment Gait with Vertical Head Turns: Mild Impairment Gait and Pivot Turn: Normal Step Over Obstacle: Mild Impairment Step Around Obstacles: Mild Impairment Steps: Mild Impairment Total Score: 18       Pertinent Vitals/Pain Pain Assessment: No/denies pain    Home Living Family/patient expects to be discharged to:: Private residence Living Arrangements: Spouse/significant other Available Help at Discharge: Available 24 hours/day Type of Home: House Home Access: Stairs to enter Entrance Stairs-Rails: None Entrance Stairs-Number of Steps: 3 Home Layout: Two level;Bed/bath upstairs        Prior Function Level of Independence: Independent         Comments: pt very active PTA     Hand Dominance   Dominant Hand: Right    Extremity/Trunk Assessment   Upper Extremity Assessment: Defer to OT evaluation           Lower Extremity Assessment: LLE deficits/detail   LLE Deficits / Details: pt with functional weakness and decreased coordination in LLE  Cervical / Trunk Assessment: Normal  Communication   Communication: No difficulties  Cognition Arousal/Alertness: Awake/alert Behavior During Therapy: WFL for tasks assessed/performed Overall Cognitive Status: Within Functional  Limits for tasks assessed                      General Comments General comments (skin integrity, edema, etc.): wife present for entire session    Exercises Other  Exercises Other Exercises: heel slide on shin Other Exercises: tossing ball with L UE Other Exercises: ankle pumps and writing alphabet in air with left foot   Assessment/Plan    PT Assessment Patient needs continued PT services  PT Problem List Decreased strength;Decreased balance;Decreased mobility;Decreased coordination          PT Treatment Interventions Gait training;Stair training;Functional mobility training;Therapeutic activities;Neuromuscular re-education;Patient/family education    PT Goals (Current goals can be found in the Care Plan section)  Acute Rehab PT Goals Patient Stated Goal: to get back to normal PT Goal Formulation: With patient Time For Goal Achievement: 11/22/15 Potential to Achieve Goals: Good    Frequency Min 4X/week   Barriers to discharge        Co-evaluation               End of Session Equipment Utilized During Treatment: Gait belt Activity Tolerance: Patient tolerated treatment well Patient left: in bed;with call bell/phone within reach;with family/visitor present Nurse Communication: Mobility status         Time: 1001-1040 PT Time Calculation (min) (ACUTE ONLY): 39 min   Charges:   PT Evaluation $PT Eval Moderate Complexity: 1 Procedure PT Treatments $Gait Training: 23-37 mins   PT G CodesMelvern Banker 11/15/2015, 11:03 AM Lavonia Dana, PT  [phone] 11/15/2015

## 2015-11-15 NOTE — Progress Notes (Signed)
Per Neurologist allowing permissive HTN for pt up to 220/120. Last BP 141/111. Monitoring closely. Willian Donson, Rande Brunt, RN

## 2015-11-15 NOTE — Progress Notes (Signed)
VASCULAR LAB PRELIMINARY  PRELIMINARY  PRELIMINARY  PRELIMINARY  Carotid duplex completed.    Preliminary report:  1-39% ICA plaquing.  Vertebral artery flow is antegrade.   Christian Jennings, RVT 11/15/2015, 4:59 PM

## 2015-11-16 ENCOUNTER — Encounter (HOSPITAL_COMMUNITY): Payer: Self-pay | Admitting: Physician Assistant

## 2015-11-16 ENCOUNTER — Inpatient Hospital Stay (HOSPITAL_COMMUNITY): Payer: Medicare Other

## 2015-11-16 DIAGNOSIS — E782 Mixed hyperlipidemia: Secondary | ICD-10-CM

## 2015-11-16 DIAGNOSIS — I480 Paroxysmal atrial fibrillation: Secondary | ICD-10-CM | POA: Diagnosis present

## 2015-11-16 DIAGNOSIS — I6789 Other cerebrovascular disease: Secondary | ICD-10-CM

## 2015-11-16 DIAGNOSIS — E785 Hyperlipidemia, unspecified: Secondary | ICD-10-CM

## 2015-11-16 DIAGNOSIS — Z8673 Personal history of transient ischemic attack (TIA), and cerebral infarction without residual deficits: Secondary | ICD-10-CM | POA: Diagnosis present

## 2015-11-16 DIAGNOSIS — I10 Essential (primary) hypertension: Secondary | ICD-10-CM | POA: Diagnosis present

## 2015-11-16 LAB — CBC WITH DIFFERENTIAL/PLATELET
Basophils Absolute: 0.1 10*3/uL (ref 0.0–0.1)
Basophils Relative: 1 %
Eosinophils Absolute: 0.2 10*3/uL (ref 0.0–0.7)
Eosinophils Relative: 2 %
HEMATOCRIT: 48.1 % (ref 39.0–52.0)
HEMOGLOBIN: 16.6 g/dL (ref 13.0–17.0)
LYMPHS ABS: 2.7 10*3/uL (ref 0.7–4.0)
Lymphocytes Relative: 30 %
MCH: 30.6 pg (ref 26.0–34.0)
MCHC: 34.5 g/dL (ref 30.0–36.0)
MCV: 88.7 fL (ref 78.0–100.0)
MONO ABS: 0.8 10*3/uL (ref 0.1–1.0)
MONOS PCT: 9 %
NEUTROS ABS: 5.3 10*3/uL (ref 1.7–7.7)
NEUTROS PCT: 58 %
Platelets: 165 10*3/uL (ref 150–400)
RBC: 5.42 MIL/uL (ref 4.22–5.81)
RDW: 13.3 % (ref 11.5–15.5)
WBC: 9 10*3/uL (ref 4.0–10.5)

## 2015-11-16 LAB — COMPREHENSIVE METABOLIC PANEL
ALK PHOS: 48 U/L (ref 38–126)
ALT: 26 U/L (ref 17–63)
ANION GAP: 10 (ref 5–15)
AST: 26 U/L (ref 15–41)
Albumin: 4 g/dL (ref 3.5–5.0)
BILIRUBIN TOTAL: 2 mg/dL — AB (ref 0.3–1.2)
BUN: 23 mg/dL — ABNORMAL HIGH (ref 6–20)
CALCIUM: 9.4 mg/dL (ref 8.9–10.3)
CO2: 24 mmol/L (ref 22–32)
Chloride: 106 mmol/L (ref 101–111)
Creatinine, Ser: 1.23 mg/dL (ref 0.61–1.24)
GFR, EST NON AFRICAN AMERICAN: 60 mL/min — AB (ref >60)
GLUCOSE: 92 mg/dL (ref 65–99)
Potassium: 3.8 mmol/L (ref 3.5–5.1)
Sodium: 140 mmol/L (ref 135–145)
TOTAL PROTEIN: 7 g/dL (ref 6.5–8.1)

## 2015-11-16 LAB — GLUCOSE, CAPILLARY: Glucose-Capillary: 96 mg/dL (ref 65–99)

## 2015-11-16 LAB — HEMOGLOBIN A1C
Hgb A1c MFr Bld: 5.2 % (ref 4.8–5.6)
MEAN PLASMA GLUCOSE: 103 mg/dL

## 2015-11-16 LAB — ECHOCARDIOGRAM COMPLETE
Height: 65 in
Weight: 2272 oz

## 2015-11-16 LAB — PHOSPHORUS: Phosphorus: 3 mg/dL (ref 2.5–4.6)

## 2015-11-16 LAB — MAGNESIUM: MAGNESIUM: 2.3 mg/dL (ref 1.7–2.4)

## 2015-11-16 MED ORDER — SODIUM CHLORIDE 0.9 % IV SOLN
INTRAVENOUS | Status: AC
  Administered 2015-11-16: 10:00:00 via INTRAVENOUS

## 2015-11-16 MED ORDER — ZOLPIDEM TARTRATE 5 MG PO TABS
5.0000 mg | ORAL_TABLET | Freq: Every evening | ORAL | Status: DC | PRN
  Administered 2015-11-16: 5 mg via ORAL
  Filled 2015-11-16: qty 1

## 2015-11-16 MED ORDER — METOPROLOL TARTRATE 25 MG PO TABS
25.0000 mg | ORAL_TABLET | Freq: Two times a day (BID) | ORAL | Status: DC
  Administered 2015-11-16 – 2015-11-17 (×2): 25 mg via ORAL
  Filled 2015-11-16 (×2): qty 1

## 2015-11-16 NOTE — Progress Notes (Addendum)
STROKE TEAM PROGRESS NOTE   SUBJECTIVE (INTERVAL HISTORY) His wife is at the bedside.  Overall he feels his condition is gradually improving. On eliquis now. In and out of Afib. Agree with cardiology consult.     OBJECTIVE Temp:  [97.9 F (36.6 C)-98.6 F (37 C)] 98.6 F (37 C) (10/01 0700) Pulse Rate:  [68-113] 81 (10/01 0700) Cardiac Rhythm: Atrial fibrillation (09/30 1900) Resp:  [18-20] 18 (10/01 0700) BP: (134-181)/(86-112) 134/86 (10/01 0700) SpO2:  [99 %] 99 % (10/01 0700)  CBC:   Recent Labs Lab 11/15/15 0607 11/16/15 0629  WBC 11.0* 9.0  NEUTROABS 7.2 5.3  HGB 17.0 16.6  HCT 49.3 48.1  MCV 89.6 88.7  PLT 166 [id number]    Basic Metabolic Panel:   Recent Labs Lab 11/15/15 0607 11/16/15 0629  NA 137 140  K 3.5 3.8  CL 103 106  CO2 24 24  GLUCOSE 88 92  BUN 16 23*  CREATININE 1.00 1.23  CALCIUM 9.5 9.4  MG 2.5* 2.3  PHOS 3.2 3.0    Lipid Panel:     Component Value Date/Time   CHOL 291 (H) 11/15/2015 0607   TRIG 96 11/15/2015 0607   HDL 56 11/15/2015 0607   CHOLHDL 5.2 11/15/2015 0607   VLDL 19 11/15/2015 0607   LDLCALC 216 (H) 11/15/2015 0607   HgbA1c: No results found for: HGBA1C Urine Drug Screen:     Component Value Date/Time   LABOPIA NONE DETECTED 11/15/2015 0820   COCAINSCRNUR NONE DETECTED 11/15/2015 0820   LABBENZ NONE DETECTED 11/15/2015 0820   AMPHETMU NONE DETECTED 11/15/2015 0820   THCU NONE DETECTED 11/15/2015 0820   LABBARB NONE DETECTED 11/15/2015 0820      IMAGING I have personally reviewed the radiological images below and agree with the radiology interpretations.  Ct Head Wo Contrast 11/14/2015 No acute abnormality. Mild appearing chronic microvascular ischemic change. Atherosclerosis.   Mr Christian Jennings Head/brain Wo Cm 11/15/2015 1. Acute/early subacute infarction and right mid corona radiata with foci extending into right lentiform nucleus, posterior limb of internal capsule, and caudate body. No hemorrhage identified.  2. No  occlusion, aneurysm, or high-grade stenosis of the circle of Willis is identified. Areas of mild stenosis and vessel irregularity in the anterior and posterior circulation probably represents intracranial atherosclerosis.   CUS  11/15/2015 1-39% ICA plaquing.  Vertebral artery flow is antegrade.   TTE  11/16/2015 Study Conclusions - Left ventricle: The cavity size was normal. Wall thickness was   normal. Systolic function was vigorous. The estimated ejection   fraction was in the range of 65% to 70%. The study was not   technically sufficient to allow evaluation of LV diastolic   dysfunction due to atrial fibrillation. - Aortic valve: There was trivial regurgitation. Valve area (VTI):   2.7 cm^2. Valve area (Vmax): 2.46 cm^2. - Left atrium: The atrium was mildly dilated. - Technically adequate study.    PHYSICAL EXAM  Temp:  [97.9 F (36.6 C)-98.6 F (37 C)] 98.6 F (37 C) (10/01 0700) Pulse Rate:  [68-113] 81 (10/01 0700) Resp:  [18-20] 18 (10/01 0700) BP: (134-181)/(86-112) 134/86 (10/01 0700) SpO2:  [99 %] 99 % (10/01 0700)  General - Well nourished, well developed, in no apparent distress.  Ophthalmologic - Sharp disc margins OU.   Cardiovascular - Regular rate and rhythm.  Mental Status -  Level of arousal and orientation to time, place, and person were intact. Language including expression, naming, repetition, comprehension was assessed and found intact, mild dysarthria.  Attention span and concentration were normal. Recent and remote memory were intact. Fund of Knowledge was assessed and was intact.  Cranial Nerves II - XII - II - Visual field intact OU. III, IV, VI - Extraocular movements intact. V - Facial sensation intact bilaterally. VII - left facial droop. VIII - Hearing & vestibular intact bilaterally. X - Palate elevates symmetrically, mild dysarthria. XI - Chin turning & shoulder shrug intact bilaterally. XII - Tongue protrusion intact.  Motor  Strength - The patient's strength was normal in all extremities except LUE 5-/5 deltoid and 4/5 bicep and tricep and finger grip with left pronator drift as well as right foot 4/5 DF.  Bulk was normal and fasciculations were absent.   Motor Tone - Muscle tone was assessed at the neck and appendages and was normal.  Reflexes - The patient's reflexes were 1+ in all extremities and he had no pathological reflexes.  Sensory - Light touch, temperature/pinprick were assessed and were symmetrical.    Coordination - The patient had normal movements in the hands with no ataxia or dysmetria.  Tremor was absent.  Gait and Station -deferred.   ASSESSMENT/PLAN [name] is a 65 y.o. male with newly diagnosed atrial fibrillation presenting with left-sided weakness. He did not receive IV t-PA due to late presentation.  Stroke: Right BG/CR infarcts - embolic secondary to  newly diagnosed atrial fibrillation.  Resultant  left facial droop and mild left hemiparesis   MRI - Acute/early subacute infarction in the right mid CR and BG.  MRA - left MCA stenosis, bilateral ICA siphon and bilateral PCAs and left VA atherosclerosis   Carotid Doppler - 1-39% ICA plaquing.  Vertebral artery flow is antegrade.   2D Echo - EF 65-70%. No cardiac source of emboli identified.  LDL - 216  HgbA1c - pending  VTE prophylaxis - Eliquis Diet Heart Room service appropriate? Yes; Fluid consistency: Thin  No antithrombotic prior to admission, now on Eliquis (apixaban) 5 mg twice a day.  Pt counseled to be compliant with his antithrombotic medications  Ongoing aggressive stroke risk factor management  Therapy recommendations: Outpatient PT recommended. OT evaluation pending.  Disposition:  Pending  Atrial fibrillation  Newly diagnosed  Agree with cardiology consult and continued outpt follow up  On metoprolol  On Eliquis 5 mg bid, tolerating well so far.   Hypertension  Blood pressure runs high  at times.  Permissive hypertension (OK if <180/105) but gradually normalize in 5-7 days  Long-term BP goal normotensive  Hyperlipidemia  Home meds:  No lipid lowering medications prior to admission.  LDL 216, goal < 70  Put on Lipitor 80 mg daily  Continue statin at discharge  Other Stroke Risk Factors  Advanced age  Family hx - Mother - TIA  Other Active Problems  Mild leukocytosis - 11.0 -> 9.0 resolved  Hospital day # 2  Neurology will sign off. Please call with questions. Pt will follow up with Cecille Rubin NP at [facility] in about 6 weeks. Thanks for the consult.   Rosalin Hawking, MD PhD Stroke Neurology 11/16/2015 12:18 PM     To contact Stroke Continuity provider, please refer to [email or web address] After hours, contact General Neurology

## 2015-11-16 NOTE — Progress Notes (Signed)
  Echocardiogram 2D Echocardiogram has been performed.  Christian Jennings 11/16/2015, 10:08 AM

## 2015-11-16 NOTE — Evaluation (Signed)
Occupational Therapy Evaluation Patient Details Name: Christian Jennings MRN: [id number] DOB: 03-07-1950 Today's Date: 11/16/2015    History of Present Illness Admitted with left sided weakness. Dx of acute stroke likely from a-fib. MRI pending. no significant PMH.    Clinical Impression   Pt was active and independent prior to admission. Presents with L side weakness and impaired coordination interfering with ability to perform ADL, IADL and mobility at his baseline. Issued pt and educated L UE coordination activities and issued med soft theraband. Recommending continued OT in a neuro outpatient setting.     Follow Up Recommendations  Outpatient OT    Equipment Recommendations       Recommendations for Other Services       Precautions / Restrictions Precautions Precautions: Fall Precaution Comments: instructed in importance of avoiding falls on blood thinner      Mobility Bed Mobility Overal bed mobility: Modified Independent                Transfers Overall transfer level: Needs assistance Equipment used: None Transfers: Sit to/from Stand Sit to Stand: Min guard              Balance     Sitting balance-Leahy Scale: Normal                                      ADL Overall ADL's : Needs assistance/impaired Eating/Feeding: Set up;Sitting Eating/Feeding Details (indicate cue type and reason): assist to cut food and open containers, finger feeding with difficulty with L hand Grooming: Wash/dry hands;Standing;Min guard   Upper Body Bathing: Minimal assitance;Sitting   Lower Body Bathing: Minimal assistance;Sit to/from stand   Upper Body Dressing : Minimal assistance;Sitting   Lower Body Dressing: Minimal assistance;Sit to/from stand   Toilet Transfer: Min guard;Ambulation;Regular Toilet   Toileting- Water quality scientist and Hygiene: Sit to/from stand;Min guard       Functional mobility during ADLs: Min guard General ADL Comments:  Instructed pt to incorporate use of L UE in ADL as much as possible. Issued and instructed pt in use of medium soft theraputty for strengthening and fine motor coordination. Wife is a Print production planner and will use fine motor items from her classroom.      Vision Additional Comments: pt with hx of eye surgery in his youth   Perception     Praxis      Pertinent Vitals/Pain Pain Assessment: No/denies pain     Hand Dominance Right   Extremity/Trunk Assessment Upper Extremity Assessment Upper Extremity Assessment: LUE deficits/detail LUE Deficits / Details: 4/5 shoulder, 4+/5 elbow/forearm, gross grasp 4/5 LUE Sensation: decreased proprioception LUE Coordination: decreased fine motor;decreased gross motor   Lower Extremity Assessment Lower Extremity Assessment: Defer to PT evaluation   Cervical / Trunk Assessment Cervical / Trunk Assessment: Normal   Communication Communication Communication: No difficulties   Cognition Arousal/Alertness: Awake/alert Behavior During Therapy: WFL for tasks assessed/performed Overall Cognitive Status: Within Functional Limits for tasks assessed                     General Comments       Exercises       Shoulder Instructions      Home Living Family/patient expects to be discharged to:: Private residence Living Arrangements: Spouse/significant other Available Help at Discharge: Available 24 hours/day Type of Home: House Home Access: Stairs to enter [employer] of Steps: 3  Entrance Stairs-Rails: None Home Layout: Two level;Bed/bath upstairs Alternate Level Stairs-Number of Steps: flight Alternate Level Stairs-Rails: Left Bathroom Shower/Tub: Occupational psychologist: Standard     Home Equipment: None          Prior Functioning/Environment Level of Independence: Independent        Comments: pt is retired, likes to [employer], golf, walk his dog, watch sports and 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         OT Problem List: Decreased strength;Impaired balance (sitting and/or standing);Decreased coordination;Impaired UE functional use;Impaired sensation   OT Treatment/Interventions: Neuromuscular education;Self-care/ADL training;Therapeutic activities;Patient/family education;Balance training    OT Goals(Current goals can be found in the care plan section) Acute Rehab OT Goals Patient Stated Goal: to get back to normal OT Goal Formulation: With patient Time For Goal Achievement: 11/23/15 Potential to Achieve Goals: Good ADL Goals Pt Will Perform Eating: with modified independence;sitting (setting up tray included) Pt Will Perform Grooming: with modified independence;standing (incorporating use of L hand  in B UE tasks) Pt Will Transfer to Toilet: with modified independence;ambulating;regular height toilet Pt Will Perform Toileting - Clothing Manipulation and hygiene: with modified independence;sit to/from stand Pt Will Perform Tub/Shower Transfer: Shower transfer;with supervision;ambulating Pt/caregiver will Perform Home Exercise Program: Increased strength;Left upper extremity;Independently (coordination and strengthening) Additional ADL Goal #1: Pt will perform basic ADL and a modified independent level.  OT Frequency: Min 2X/week   Barriers to D/C:            Co-evaluation              End of Session Equipment Utilized During Treatment: Gait belt  Activity Tolerance: Patient tolerated treatment well Patient left: in bed;with call bell/phone within reach;with bed alarm set;with family/visitor present   Time: [id number] OT Time Calculation (min): 33 min Charges:  OT General Charges $OT Visit: 1 Procedure OT Evaluation $OT Eval Moderate Complexity: 1 Procedure OT Treatments $Neuromuscular Re-education: 8-22 mins G-Codes:    Malka So 11/16/2015, 12:31 PM  [phone]

## 2015-11-16 NOTE — Plan of Care (Signed)
Problem: Coping: Goal: Ability to verbalize positive feelings about self will improve Outcome: Progressing Pt is down on himself, 'this is all my fault, I didn't go to a dr for many years'

## 2015-11-16 NOTE — Consult Note (Signed)
CARDIOLOGY CONSULT NOTE   Patient ID: Christian Jennings MRN: [id number] DOB/AGE: [birth year] 65 y.o.  Admit date: 11/14/2015  Requesting Physician: Dr. Erlinda Hong Primary Physician:   No PCP Per Patient Primary Cardiologist:  New Reason for Consultation:   afib in the setting of acute CVA  HPI: AURTHER MCQUILLIN is a 65 y.o. male with no previous medical history (likley by virtue of not seeing a medical provider) who presented to [facility] ED on 11/14/15 with left sided weakness. He was found to have afib with RVR as well as an acute CVA. Cardiology consulted for help managing atrial fibrillation.  Patient hasn't seen a physician in over 20 years and was not taking any medications besides lethicin to help keep lipids from "layering into plaque in the body".   He was in his usual state of health until about 4 days ago when he started to notice left sided weakness while playing tennis. He then noticed over the course of the past few days that things still seemed to be not quite right including that his left face wasn't quite normal and it was difficult to shampoo his hair. He presented to [facility] on 11/14/15 for evaluation. He was found to be in afib with RVR HR 174 and transferred to [facility] for further work up and admission.  CT head showed no acute abnormality but did show atherosclerosis and mild appearing chronic microvascular ischemic changes. MRI showed an acute/early subacute infarction in the right mid CR and BG and MRA showed left MCA stenosis, bilateral ICA siphon and bilateral PCAs and left VA atherosclerosis.   He is currently undergoing full stroke w/up. 2D ECHO showed normal LV function and mild LAE. Carotid Doppler showed 1-39% ICA stenosis. LDL 216 and he was started on Lipitor 80mg  daily. He was also started on Eliquis 5mg  BID and Metoprolol 25mg  BID for Afib.   Since admission he has been in and out of afib with RVR. This is essentially asymptomatic. Sometimes he can feel an epigastric fluttering  that he previously attributed to his hiatal hernia. He denies chest pain or SOB and is very active playing tennis. No LE edema, orthopnea or PND. No dizziness or syncope.   His father (heavy smoker drinker died of a cardiac arrest in early 66s and maternal uncle had CAD in 47s). He does not smoke.    Past Medical History:  Diagnosis Date  . CVA (cerebral vascular accident) ([place])   . HLD (hyperlipidemia)   . HTN (hypertension)   . PAF (paroxysmal atrial fibrillation) ([place])      Past Surgical History:  Procedure Laterality Date  . EYE SURGERY     As child for muscle issue  . EYE SURGERY     Cosmetic for drooping eyelid    No Known Allergies  I have reviewed the patient's current medications . apixaban  5 mg Oral BID  . atorvastatin  80 mg Oral q1800  . metoprolol tartrate  25 mg Oral BID  . pneumococcal 23 valent vaccine  0.5 mL Intramuscular Tomorrow-1000   . sodium chloride 75 mL/hr at 11/16/15 0943   metoprolol, senna-docusate  Prior to Admission medications   Medication Sig Start Date End Date Taking? Authorizing Provider  ALPHA LIPOIC ACID PO Take by mouth.   Yes Historical Provider, MD  CINNAMON PO Take by mouth.   Yes Historical Provider, MD  co-enzyme Q-10 30 MG capsule Take 30 mg by mouth 3 (three) times daily.   Yes  Historical Provider, MD  L-ARGININE PO Take by mouth.   Yes Historical Provider, MD  Lecithin GRAN by Does not apply route.   Yes Historical Provider, MD  NATTOKINASE PO Take by mouth.   Yes Historical Provider, MD  omega-3 acid ethyl esters (LOVAZA) 1 g capsule Take by mouth 2 (two) times daily.   Yes Historical Provider, MD  PHYTOSTEROLS PO Take by mouth.   Yes Historical Provider, MD     Social History   Social History  . Marital status: Married    Spouse name: N/A  . Number of children: N/A  . Years of education: N/A   Occupational History  . Not on file.   Social History Main Topics  . Smoking status: Never Smoker  . Smokeless  tobacco: Never Used  . Alcohol use No  . Drug use: Unknown  . Sexual activity: Yes    Birth control/ protection: None   Other Topics Concern  . Not on file   Social History Narrative  . No narrative on file    Family Status  Relation Status  . Mother Alive  . Father Deceased  . Brother    Family History  Problem Relation Age of Onset  . Transient ischemic attack Mother   . Alcoholism Father   . COPD Father   . Throat cancer Brother     Or esophageal   ROS:  Full 14 point review of systems complete and found to be negative unless listed above.  Physical Exam: Blood pressure 134/86, pulse 81, temperature 98.6 F (37 C), temperature source Oral, resp. rate 18, height 5\' 5"  (1.651 m), weight 142 lb (64.4 kg), SpO2 99 %.  General: Well developed, well nourished, male in no acute distress Head: Eyes PERRLA, No xanthomas.   Normocephalic and atraumatic, oropharynx without edema or exudate. Dentition:  Lungs: CTAB Heart: HRRR S1 S2, no rub/gallop, Heart regular rate and rhythm with S1, S2  murmur. pulses are 2+ extrem.   Neck: No carotid bruits. No lymphadenopathy.  No JVD. Abdomen: Bowel sounds present, abdomen soft and non-tender without masses or hernias noted. Msk:  No spine or cva tenderness. No weakness, no joint deformities or effusions. Extremities: No clubbing or cyanosis. No LE edema.  Neuro: Alert and oriented X 3. No focal deficits noted. Psych:  Good affect, responds appropriately Skin: No rashes or lesions noted.  Labs:   Lab Results  Component Value Date   WBC 9.0 11/16/2015   HGB 16.6 11/16/2015   HCT 48.1 11/16/2015   MCV 88.7 11/16/2015   PLT 165 11/16/2015   No results for input(s): INR in the last 72 hours.   Recent Labs Lab 11/16/15 0629  NA 140  K 3.8  CL 106  CO2 24  BUN 23*  CREATININE 1.23  CALCIUM 9.4  PROT 7.0  BILITOT 2.0*  ALKPHOS 48  ALT 26  AST 26  GLUCOSE 92  ALBUMIN 4.0   Magnesium  Date Value Ref Range Status    11/16/2015 2.3 1.7 - 2.4 mg/dL Final    Recent Labs  11/14/15 1029  TROPONINI <0.03   No results for input(s): TROPIPOC in the last 72 hours. No results found for: PROBNP Lab Results  Component Value Date   CHOL 291 (H) 11/15/2015   HDL 56 11/15/2015   LDLCALC 216 (H) 11/15/2015   TRIG 96 11/15/2015   No results found for: DDIMER No results found for: LIPASE, AMYLASE TSH  Date/Time Value Ref Range Status  11/15/2015  06:07 AM 6.379 (H) 0.350 - 4.500 uIU/mL Final   No results found for: VITAMINB12, FOLATE, FERRITIN, TIBC, IRON, RETICCTPCT   Radiology:  Mr Brain 66 Contrast  Result Date: 11/15/2015 CLINICAL DATA:  65 y/o M; no known past medical history presenting with left-sided weakness. EXAM: MRI HEAD WITHOUT CONTRAST MRA HEAD WITHOUT CONTRAST TECHNIQUE: Multiplanar, multiecho pulse sequences of the brain and surrounding structures were obtained without intravenous contrast. Angiographic images of the head were obtained using MRA technique without contrast. COMPARISON:  11/14/2015 CT head. FINDINGS: MRI HEAD FINDINGS Brain: Focus of restricted diffusion within the right body of caudate and mid corona radiata with additional small foci in the right putamin and posterior limb of internal capsule mild associated T2 FLAIR hyperintensity consistent with acute/ early subacute infarction. No additional diffusion abnormality is identified. Scattered nonspecific foci of T2 FLAIR hyperintensity in subcortical and periventricular white matter with frontal parietal predominance are consistent with mild chronic microvascular ischemic changes. No abnormal susceptibility hypointensity to indicate intracranial hemorrhage. No extra-axial collection. Vascular: See below. Skull and upper cervical spine: Normal marrow signal. Sinuses/Orbits: Negative. Other: None. MRA HEAD FINDINGS Bilateral internal carotid arteries, middle cerebral arteries, and anterior cerebral arteries are patent. Mild narrowing of  proximal left M2 inferior division. No occlusion, aneurysm, or significant stenosis is identified. Left dominant vertebrobasilar system. Bilateral V4 segments, bilateral PICA, the basilar artery, bilateral AICA, bilateral SCA, and bilateral posterior cerebral arteries are patent. Mild irregularity of the left V4 segment and right proximal PCA probably reflects underlying atherosclerosis with mild associated stenosis. No occlusion, aneurysm, or significant stenosis is identified. Patent anterior communicating artery. Large right posterior communicating artery with small right P1 segment consistent with persistent fetal circulation. No left posterior communicating artery identified, hypoplastic or absent. IMPRESSION: 1. Acute/early subacute infarction and right mid corona radiata with foci extending into right lentiform nucleus, posterior limb of internal capsule, and caudate body. No hemorrhage identified. 2. No occlusion, aneurysm, or high-grade stenosis of the circle of Willis is identified. Areas of mild stenosis and vessel irregularity in the anterior and posterior circulation probably represents intracranial atherosclerosis. These results will be called to the ordering clinician or representative by the Radiologist Assistant, and communication documented in the PACS or zVision Dashboard. Electronically Signed   By: Kristine Garbe M.D.   On: 11/15/2015 00:38   Mr [name] Head/brain [id number] Cm  Result Date: 11/15/2015 CLINICAL DATA:  65 y/o M; no known past medical history presenting with left-sided weakness. EXAM: MRI HEAD WITHOUT CONTRAST MRA HEAD WITHOUT CONTRAST TECHNIQUE: Multiplanar, multiecho pulse sequences of the brain and surrounding structures were obtained without intravenous contrast. Angiographic images of the head were obtained using MRA technique without contrast. COMPARISON:  11/14/2015 CT head. FINDINGS: MRI HEAD FINDINGS Brain: Focus of restricted diffusion within the right body of caudate  and mid corona radiata with additional small foci in the right putamin and posterior limb of internal capsule mild associated T2 FLAIR hyperintensity consistent with acute/ early subacute infarction. No additional diffusion abnormality is identified. Scattered nonspecific foci of T2 FLAIR hyperintensity in subcortical and periventricular white matter with frontal parietal predominance are consistent with mild chronic microvascular ischemic changes. No abnormal susceptibility hypointensity to indicate intracranial hemorrhage. No extra-axial collection. Vascular: See below. Skull and upper cervical spine: Normal marrow signal. Sinuses/Orbits: Negative. Other: None. MRA HEAD FINDINGS Bilateral internal carotid arteries, middle cerebral arteries, and anterior cerebral arteries are patent. Mild narrowing of proximal left M2 inferior division. No occlusion, aneurysm, or significant stenosis is  identified. Left dominant vertebrobasilar system. Bilateral V4 segments, bilateral PICA, the basilar artery, bilateral AICA, bilateral SCA, and bilateral posterior cerebral arteries are patent. Mild irregularity of the left V4 segment and right proximal PCA probably reflects underlying atherosclerosis with mild associated stenosis. No occlusion, aneurysm, or significant stenosis is identified. Patent anterior communicating artery. Large right posterior communicating artery with small right P1 segment consistent with persistent fetal circulation. No left posterior communicating artery identified, hypoplastic or absent. IMPRESSION: 1. Acute/early subacute infarction and right mid corona radiata with foci extending into right lentiform nucleus, posterior limb of internal capsule, and caudate body. No hemorrhage identified. 2. No occlusion, aneurysm, or high-grade stenosis of the circle of Willis is identified. Areas of mild stenosis and vessel irregularity in the anterior and posterior circulation probably represents intracranial  atherosclerosis. These results will be called to the ordering clinician or representative by the Radiologist Assistant, and communication documented in the PACS or zVision Dashboard. Electronically Signed   By: Kristine Garbe M.D.   On: 11/15/2015 00:38   Mr [name] Head/brain Wo Cm 11/15/2015 1. Acute/early subacute infarction and right mid corona radiata with foci extending into right lentiform nucleus, posterior limb of internal capsule, and caudate body. No hemorrhage identified.  2. No occlusion, aneurysm, or high-grade stenosis of the circle of Willis is identified. Areas of mild stenosis and vessel irregularity in the anterior and posterior circulation probably represents intracranial atherosclerosis.   CUS  11/15/2015 1-39% ICA plaquing. Vertebral artery flow is antegrade.   TTE  11/16/2015 Study Conclusions - Left ventricle: The cavity size was normal. Wall thickness was normal. Systolic function was vigorous. The estimated ejection fraction was in the range of 65% to 70%. The study was not technically sufficient to allow evaluation of LV diastolic dysfunction due to atrial fibrillation. - Aortic valve: There was trivial regurgitation. Valve area (VTI): 2.7 cm^2. Valve area (Vmax): 2.46 cm^2. - Left atrium: The atrium was mildly dilated. - Technically adequate study.   ECG: Atrial fibrillation with RVR HR 175 Some ST depression, probably rate related   ASSESSMENT AND PLAN:    Principal Problem:   Acute ischemic stroke ([place]) Active Problems:   Atrial fibrillation with rapid ventricular response ([facility])   Essential hypertension   Hyperlipidemia   PAF (paroxysmal atrial fibrillation) ([facility])   HTN (hypertension)   CVA (cerebral vascular accident) ([place])  PAF: noted to be in afib with RVR with HR 174 on admission. Has been in and out of afib since admission.  Currently maintaining NSR -- Currently on Lopressor 25mg  BID in oral suspension. He has a normal  diet and taking other PO meds, so I will switch it to PO -- CHADSVASC score of at least 4 (HTN, age, CVA). Appropriately started on Eliquis 5mg  BID -- TSH mildly elevated but normal T4 -- 2D ECHO with normal LV function and mild LAE.  HTN: likely undiagnosed HTN. Allowed for permissive HTN given acute CVA but will need aggressive control of this going forward. Continue Lopressor 25mg  BID for now  HLD: markedly elevated LDL of 216. TC 291. Started on atorva 80mg  daily. He has many questions about this and wants to know if he can ever go down on dose or have it be discontinued.   Elevated TSH: normal T4.   Signed: Angelena Form, PA-C 11/16/2015 2:19 PM  Pager [phone]  Co-Sign MD  History and all data above reviewed.  Patient examined.  I agree with the findings as above.  The patient denies any  palpitations, presyncope or syncope.  His symptoms really started 3 days prior to his presentation when he noted some left sided weakness while practicing his tennis serve.  Found on admission to have intermittent atrial fib.  The patient exam reveals COR:RRR  ,  Lungs: Clear  ,  Abd: Positive bowel sounds, no rebound no guarding, Ext No edema  .  All available labs, radiology testing, previous records reviewed. Agree with documented assessment and plan. Atrial fib: Agree with anticoagulation and beta blocker.  No indication for ablation of antiarrhythmic therapy since he does not feel these palpitations and is on life long anticoagulation now.  HTN:  Can titrate beta blocker for further manage.  Dyslipidemia:  He meets criteria for statin with an LDL that is very elevated.  Discussed the risks benefits with him and he agrees to continue.    Jeneen Rinks Ifeoma Vallin  3:43 PM  11/16/2015

## 2015-11-16 NOTE — Progress Notes (Signed)
PROGRESS NOTE    Christian Jennings  [id number] DOB: [birth year] DOA: 11/14/2015 PCP: No PCP Per Patient; Has not been to a Physician in 20 years.  Brief Narrative:  The patient is a very pleasant 65 yo Caucasian Male with a no real PMH except Left eye surgery who presented to [facility] with Left Sided weakness. He noticed symptoms starting on Tuesday when he was playing tennis and Wednesday he woke up and noticed some dizziness, similar to previous episodes of vertigo but no chest discomfort, chest pain, shortness of breath, syncope, exertional dyspnea.  He also continued to have left-sided weakness (couldn't shampoo his hair as well with his left hand, left leg felt clumsier). He presented to urgent care who ultimately referred him to the ER where he was worked up and underwent CT Scan without Contrast as well as MRI and MRA of Head without Contrast. Impression of MRI/MRA showed an acute/early subacute infarction and Right Mid Coronoa Radiata with Foci extending into the lentiform nucleus, posterior limb of internal capsule, and cuadate body with no hemorrhage. He was admitted and Neurology was consulted. Today Cardiology was consulted to follow his Atrial Fibrillation with RVR  Assessment & Plan:   Principal Problem:   Acute ischemic stroke [facility]) Active Problems:   Atrial fibrillation with rapid ventricular response (HCC)   Essential hypertension   Hyperlipidemia   PAF (paroxysmal atrial fibrillation) (HCC)   HTN (hypertension)   CVA (cerebral vascular accident) ([place])  ASSESSMENTS  1. Acute/Subacute Right Mid Corona Radiata CVA with Foci extending into Right Lentiform Nucleus, Posterior Limb of Internal Capsule, and Caudate body likely Embolic in Nature from Atrial Fibrillation with RVR. 2. Paroxysmal Atrial Fibrillation with RVR with intermittent spontaneous conversion to NSR 3. Hypertension 4. Hyperlipidemia 5. Suspected Hypothyroidism  PLAN  1. Acute/Subacute Right Mid Corona  Radiata CVA with Foci extending into Right Lentiform Nucleus, Posterior Limb of Internal Capsule, and Caudate body likely Embolic in Nature from Atrial Fibrillation with RVR. -Neurology Stroke Team Consulted and Appreciate Recommendations. Discussed Case with Dr. Erlinda Hong -CT Head showed No acute abnormality. Mild appearing chronic microvascular ischemic change. -MRI/MRA of Head showed Acute/early subacute infarction and right mid corona radiata with foci extending into right lentiform nucleus, posterior limb of internal capsule, and caudate body. No hemorrhage identified.No occlusion, aneurysm, or high-grade stenosis of the circle of Willis is identified. Areas of mild stenosis and vessel irregularity in the anterior and posterior circulation probably representsintracranial atherosclerosis. -Carotid Dopplers Done and pending to be Read by Radiologist; Preliminary Report shows 1-39% ICA plaquing.  Vertebral artery flow is antegrade. -Transthoracic Echocardiogram showed Left ventricle: The cavity size was normal. Wall thickness was normal. Systolic function was vigorous. The estimated ejection fraction was in the range of 65% to 70%. The study was not technically sufficient to allow evaluation of LV diastolic   dysfunction due to atrial fibrillation. Aortic valve: There was trivial regurgitation. Valve area (VTI):   2.7 cm^2. Valve area (Vmax): 2.46 cm^2.Left atrium: The atrium was mildly dilated.  -Lipid Panel showed Cholesterol of 291, HDL of 56, LDL of 216, TG of 96 -HbA1c Pending -Apixaban 5 mg po BID; ASA 325 mg po D/C'd by Neurology -PT/OT/SLP Evaluate and treat - PT and OT both Say Outpatient PT -Neurochecks per protocol -Continue with Telemetry -Allow for Permissive HTN  2. Paroxysmal Atrial Fibrillation with RVR with intermittent spontaneous conversion to NSR -Previously on Cardizem gtt -Patient is on Telemetry now -CHADSVASC - 4 because of Age, HTN, and CVA -  ECHO showed  Left ventricle: The  cavity size was normal. Wall thickness was normal. Systolic function was vigorous. The estimated ejection fraction was in the range of 65% to 70%. The study was not technically sufficient to allow evaluation of LV diastolic   dysfunction due to atrial fibrillation. Aortic valve: There was trivial regurgitation. Valve area (VTI):   2.7 cm^2. Valve area (Vmax): 2.46 cm^2.Left atrium: The atrium was mildly dilated.  -Initiated Anticoagulation with Apixiban 5 mg po BID -C/w Metoprolol 25 mg po BID   -C/w IV Metoprolol 2.5 mg IV q6hprn -Continue to Monitor  [facility]iology Dr. Percival Spanish for evaluation; Appreciate additional recc's  3. Hypertension -Allow for Permissive HTN. BP has been ranging from 134-181 SBP and 86-108 -Continue with Metoprolol 25 mg po BID -C/w IV Metoprolol 2.5 mg IV q6hprn -Will likely add Lisinopril or Amlodipine eventually.   4. Hyperlipidemia -Lipid Panel showed Cholesterol of 291, HDL of 56, LDL of 216, TG of 96 -Patient was started on Atorvastatin 80 mg po qHS  5. Subclinical Hypothyroidism -TSH was 6.379; Obtaining Free T4 0.99  -Will consider 25 mcg of Levothyroxine  DVT prophylaxis: Anticoagulated with Apixaban Code Status: Full Family Communication: Discussed plan of Care with Patients wife who is in agreement. All questions answered to her and patient's satisfaction. Disposition Plan: Likely D/C Home with Home Health  Consultants:   Neurology Dr. Erlinda Hong  Cardiology Dr. Percival Spanish   Procedures: None Antimicrobials: None  Subjective: Patient was seen and examined at bedside and he was doing better. He was encouraged with what PT told him. Denied CP/SOB, N/V/ Lightheadness or Dizziness. Stated he was still a little weak on his Left side. No other complaints or concerns at this time. Would like a PCP when being D/C'd.   Objective: Vitals:   11/16/15 0500 11/16/15 0700 11/16/15 1000 11/16/15 1421  BP: (!) 181/108 134/86 (!) 168/110 (!) 154/97  Pulse:  76 81 85 76  Resp: 19 18 20 18   Temp: 97.9 F (36.6 C) 98.6 F (37 C) 98.2 F (36.8 C) 98.5 F (36.9 C)  TempSrc: Oral Oral Oral Oral  SpO2: 99% 99% 100% 99%  Weight:      Height:        Intake/Output Summary (Last 24 hours) at 11/16/15 1548 Last data filed at 11/16/15 0600  Gross per 24 hour  Intake              150 ml  Output                0 ml  Net              150 ml   Filed Weights   11/14/15 1016  Weight: 64.4 kg (142 lb)    Examination: Physical Exam:  Constitutional: Thin, NAD and appears calm and comfortable Eyes:  Left Lid Droop significantly noted, sclerae anicteric  ENMT: External Ears, Nose appear normal. Grossly normal hearing. Mucous membranes appear moist. Normal dentition.  Neck: Appears normal, supple, no cervical masses, normal ROM, no appreciable thyromegaly Respiratory: Clear to auscultation bilaterally, no wheezing, rales, rhonchi or crackles. Normal respiratory effort and patient is not tachypenic. No accessory muscle use.  Cardiovascular: Irregularly Irregular Rhythm and tachycardic rate, no murmurs / rubs / gallops. S1 and S2 auscultated. No extremity edema. 2+ pedal pulses.  Abdomen: Soft, non-tender, non-distended. No masses palpated. No appreciable hepatosplenomegaly. Bowel sounds positive x4.  GU: Deferred. Musculoskeletal: No clubbing / cyanosis of digits/nails. No joint deformity upper and  lower extremities. Weaker strength in Left Upper Extremity specifically Grip. Normal muscle tone.  Skin: No rashes, lesions, ulcers. No induration; Warm and dry.  Neurologic: Left sided facial droop and Eyelid droop appreciated. Sensation intact in all 4 Extremities, DTR hyperreflexive on Left knee. Strength 4/5 in Left Upper Extremity. Romberg sign cerebellar reflexes not assessed.  Psychiatric: Normal judgment and insight. Alert and oriented x 3. Anxious mood and appropriate affect.   Data Reviewed: I have reviewed the following data.   CBC:  Recent  Labs Lab 11/14/15 1029 11/15/15 0607 11/16/15 0629  WBC 10.9* 11.0* 9.0  NEUTROABS  --  7.2 5.3  HGB 17.8* 17.0 16.6  HCT 51.3 49.3 48.1  MCV 86.9 89.6 88.7  PLT 201 166 [id number]   Basic Metabolic Panel:  Recent Labs Lab 11/14/15 1029 11/15/15 0607 11/16/15 0629  NA 139 137 140  K 3.9 3.5 3.8  CL 105 103 106  CO2 24 24 24   GLUCOSE 133* 88 92  BUN 19 16 23*  CREATININE 1.08 1.00 1.23  CALCIUM 9.6 9.5 9.4  MG  --  2.5* 2.3  PHOS  --  3.2 3.0   GFR: Estimated Creatinine Clearance: 52.1 mL/min (by C-G formula based on SCr of 1.23 mg/dL). Liver Function Tests:  Recent Labs Lab 11/14/15 1029 11/15/15 0607 11/16/15 0629  AST 27 26 26   ALT 31 28 26   ALKPHOS 60 53 48  BILITOT 1.4* 1.5* 2.0*  PROT 8.4* 7.1 7.0  ALBUMIN 5.0 4.4 4.0   No results for input(s): LIPASE, AMYLASE in the last 168 hours. No results for input(s): AMMONIA in the last 168 hours. Coagulation Profile: No results for input(s): INR, PROTIME in the last 168 hours. Cardiac Enzymes:  Recent Labs Lab 11/14/15 1029  TROPONINI <0.03   BNP (last 3 results) No results for input(s): PROBNP in the last 8760 hours. HbA1C:  Recent Labs  11/15/15 0607  HGBA1C 5.2   CBG:  Recent Labs Lab 11/15/15 0640 11/15/15 1142 11/15/15 1612 11/15/15 2118 11/16/15 0602  GLUCAP 109* 123* 105* 100* 96   Lipid Profile:  Recent Labs  11/15/15 0607  CHOL 291*  HDL 56  LDLCALC 216*  TRIG 96  CHOLHDL 5.2   Thyroid Function Tests:  Recent Labs  11/15/15 0607 11/15/15 1209  TSH 6.379*  --   FREET4  --  0.99   Anemia Panel: No results for input(s): VITAMINB12, FOLATE, FERRITIN, TIBC, IRON, RETICCTPCT in the last 72 hours. Sepsis Labs: No results for input(s): PROCALCITON, LATICACIDVEN in the last 168 hours.  No results found for this or any previous visit (from the past 240 hour(s)).   Radiology Studies: Mr Brain 54 Contrast  Result Date: 11/15/2015 CLINICAL DATA:  65 y/o M; no known past  medical history presenting with left-sided weakness. EXAM: MRI HEAD WITHOUT CONTRAST MRA HEAD WITHOUT CONTRAST TECHNIQUE: Multiplanar, multiecho pulse sequences of the brain and surrounding structures were obtained without intravenous contrast. Angiographic images of the head were obtained using MRA technique without contrast. COMPARISON:  11/14/2015 CT head. FINDINGS: MRI HEAD FINDINGS Brain: Focus of restricted diffusion within the right body of caudate and mid corona radiata with additional small foci in the right putamin and posterior limb of internal capsule mild associated T2 FLAIR hyperintensity consistent with acute/ early subacute infarction. No additional diffusion abnormality is identified. Scattered nonspecific foci of T2 FLAIR hyperintensity in subcortical and periventricular white matter with frontal parietal predominance are consistent with mild chronic microvascular ischemic changes. No abnormal susceptibility  hypointensity to indicate intracranial hemorrhage. No extra-axial collection. Vascular: See below. Skull and upper cervical spine: Normal marrow signal. Sinuses/Orbits: Negative. Other: None. MRA HEAD FINDINGS Bilateral internal carotid arteries, middle cerebral arteries, and anterior cerebral arteries are patent. Mild narrowing of proximal left M2 inferior division. No occlusion, aneurysm, or significant stenosis is identified. Left dominant vertebrobasilar system. Bilateral V4 segments, bilateral PICA, the basilar artery, bilateral AICA, bilateral SCA, and bilateral posterior cerebral arteries are patent. Mild irregularity of the left V4 segment and right proximal PCA probably reflects underlying atherosclerosis with mild associated stenosis. No occlusion, aneurysm, or significant stenosis is identified. Patent anterior communicating artery. Large right posterior communicating artery with small right P1 segment consistent with persistent fetal circulation. No left posterior communicating  artery identified, hypoplastic or absent. IMPRESSION: 1. Acute/early subacute infarction and right mid corona radiata with foci extending into right lentiform nucleus, posterior limb of internal capsule, and caudate body. No hemorrhage identified. 2. No occlusion, aneurysm, or high-grade stenosis of the circle of Willis is identified. Areas of mild stenosis and vessel irregularity in the anterior and posterior circulation probably represents intracranial atherosclerosis. These results will be called to the ordering clinician or representative by the Radiologist Assistant, and communication documented in the PACS or zVision Dashboard. Electronically Signed   By: Kristine Garbe M.D.   On: 11/15/2015 00:38   Mr [name] Head/brain [id number] Cm  Result Date: 11/15/2015 CLINICAL DATA:  65 y/o M; no known past medical history presenting with left-sided weakness. EXAM: MRI HEAD WITHOUT CONTRAST MRA HEAD WITHOUT CONTRAST TECHNIQUE: Multiplanar, multiecho pulse sequences of the brain and surrounding structures were obtained without intravenous contrast. Angiographic images of the head were obtained using MRA technique without contrast. COMPARISON:  11/14/2015 CT head. FINDINGS: MRI HEAD FINDINGS Brain: Focus of restricted diffusion within the right body of caudate and mid corona radiata with additional small foci in the right putamin and posterior limb of internal capsule mild associated T2 FLAIR hyperintensity consistent with acute/ early subacute infarction. No additional diffusion abnormality is identified. Scattered nonspecific foci of T2 FLAIR hyperintensity in subcortical and periventricular white matter with frontal parietal predominance are consistent with mild chronic microvascular ischemic changes. No abnormal susceptibility hypointensity to indicate intracranial hemorrhage. No extra-axial collection. Vascular: See below. Skull and upper cervical spine: Normal marrow signal. Sinuses/Orbits: Negative. Other: None.  MRA HEAD FINDINGS Bilateral internal carotid arteries, middle cerebral arteries, and anterior cerebral arteries are patent. Mild narrowing of proximal left M2 inferior division. No occlusion, aneurysm, or significant stenosis is identified. Left dominant vertebrobasilar system. Bilateral V4 segments, bilateral PICA, the basilar artery, bilateral AICA, bilateral SCA, and bilateral posterior cerebral arteries are patent. Mild irregularity of the left V4 segment and right proximal PCA probably reflects underlying atherosclerosis with mild associated stenosis. No occlusion, aneurysm, or significant stenosis is identified. Patent anterior communicating artery. Large right posterior communicating artery with small right P1 segment consistent with persistent fetal circulation. No left posterior communicating artery identified, hypoplastic or absent. IMPRESSION: 1. Acute/early subacute infarction and right mid corona radiata with foci extending into right lentiform nucleus, posterior limb of internal capsule, and caudate body. No hemorrhage identified. 2. No occlusion, aneurysm, or high-grade stenosis of the circle of Willis is identified. Areas of mild stenosis and vessel irregularity in the anterior and posterior circulation probably represents intracranial atherosclerosis. These results will be called to the ordering clinician or representative by the Radiologist Assistant, and communication documented in the PACS or zVision Dashboard. Electronically Signed   By: Mia Creek  [place]urus[place]-Stratton M.D.   On: 11/15/2015 00:38   ECHOCARDIOGRAM: - Left ventricle: The cavity size was normal. Wall thickness was   normal. Systolic function was vigorous. The estimated ejection   fraction was in the range of 65% to 70%. The study was not   technically sufficient to allow evaluation of LV diastolic   dysfunction due to atrial fibrillation. - Aortic valve: There was trivial regurgitation. Valve area (VTI):   2.7 cm^2. Valve area  (Vmax): 2.46 cm^2. - Left atrium: The atrium was mildly dilated. - Technically adequate study.  CAROTID DOPPLERS: Done but Official Read Pending: Preliminary Report shows 1-39% ICA plaquing.  Vertebral artery flow is antegrade.  Scheduled Meds: . apixaban  5 mg Oral BID  . atorvastatin  80 mg Oral q1800  . metoprolol tartrate  25 mg Oral BID  . pneumococcal 23 valent vaccine  0.5 mL Intramuscular Tomorrow-1000   Continuous Infusions: . sodium chloride 75 mL/hr at 11/16/15 0943     LOS: 2 days    Kerney Elbe, DO [facility] Pager [phone]  If 7PM-7AM, please contact night-coverage [email or web address] Password [email or web address] 11/16/2015, 3:48 PM

## 2015-11-17 LAB — CBC WITH DIFFERENTIAL/PLATELET
BASOS ABS: 0.1 10*3/uL (ref 0.0–0.1)
BASOS PCT: 1 %
EOS PCT: 2 %
Eosinophils Absolute: 0.2 10*3/uL (ref 0.0–0.7)
HCT: 46.1 % (ref 39.0–52.0)
Hemoglobin: 15.5 g/dL (ref 13.0–17.0)
LYMPHS PCT: 27 %
Lymphs Abs: 2.3 10*3/uL (ref 0.7–4.0)
MCH: 30.1 pg (ref 26.0–34.0)
MCHC: 33.6 g/dL (ref 30.0–36.0)
MCV: 89.5 fL (ref 78.0–100.0)
Monocytes Absolute: 0.7 10*3/uL (ref 0.1–1.0)
Monocytes Relative: 9 %
Neutro Abs: 5.3 10*3/uL (ref 1.7–7.7)
Neutrophils Relative %: 61 %
PLATELETS: 169 10*3/uL (ref 150–400)
RBC: 5.15 MIL/uL (ref 4.22–5.81)
RDW: 13.2 % (ref 11.5–15.5)
WBC: 8.5 10*3/uL (ref 4.0–10.5)

## 2015-11-17 LAB — COMPREHENSIVE METABOLIC PANEL
ALBUMIN: 3.8 g/dL (ref 3.5–5.0)
ALT: 25 U/L (ref 17–63)
AST: 21 U/L (ref 15–41)
Alkaline Phosphatase: 46 U/L (ref 38–126)
Anion gap: 6 (ref 5–15)
BUN: 20 mg/dL (ref 6–20)
CHLORIDE: 110 mmol/L (ref 101–111)
CO2: 26 mmol/L (ref 22–32)
CREATININE: 1.07 mg/dL (ref 0.61–1.24)
Calcium: 9.2 mg/dL (ref 8.9–10.3)
GFR calc Af Amer: >60 mL/min (ref >60)
GLUCOSE: 102 mg/dL — AB (ref 65–99)
POTASSIUM: 3.6 mmol/L (ref 3.5–5.1)
SODIUM: 142 mmol/L (ref 135–145)
Total Bilirubin: 1.4 mg/dL — ABNORMAL HIGH (ref 0.3–1.2)
Total Protein: 6.6 g/dL (ref 6.5–8.1)

## 2015-11-17 LAB — MAGNESIUM: Magnesium: 2.4 mg/dL (ref 1.7–2.4)

## 2015-11-17 LAB — HEMOGLOBIN A1C
HEMOGLOBIN A1C: 5.3 % (ref 4.8–5.6)
MEAN PLASMA GLUCOSE: 105 mg/dL

## 2015-11-17 MED ORDER — ATORVASTATIN CALCIUM 80 MG PO TABS
80.0000 mg | ORAL_TABLET | Freq: Every day | ORAL | 0 refills | Status: DC
Start: 2015-11-17 — End: 2015-12-01

## 2015-11-17 MED ORDER — APIXABAN 5 MG PO TABS: 5.0000 mg | ORAL_TABLET | Freq: Two times a day (BID) | ORAL | 0 refills | Status: DC

## 2015-11-17 MED ORDER — METOPROLOL TARTRATE 25 MG PO TABS: 25.0000 mg | ORAL_TABLET | Freq: Two times a day (BID) | ORAL | 0 refills | Status: DC

## 2015-11-17 NOTE — Progress Notes (Signed)
Physical Therapy Treatment Patient Details Name: Christian Jennings MRN: [id number] DOB: 13-Mar-1950 Today's Date: 11/17/2015    History of Present Illness Admitted with left sided weakness. Dx of acute stroke likely from a-fib. MRI pending. no significant PMH.     PT Comments    Progressing well.  Spent a lot of time working on balance challenges and managing fatigue safely.  DGI still rates pt at a risk for falls.  Follow Up Recommendations  Outpatient PT;Supervision for mobility/OOB     Equipment Recommendations  None recommended by PT    Recommendations for Other Services       Precautions / Restrictions Precautions Precautions: Fall Precaution Comments: instructed in importance of avoiding falls on blood thinner    Mobility  Bed Mobility                  Transfers Overall transfer level: Needs assistance Equipment used: None Transfers: Sit to/from Stand Sit to Stand: Min guard         General transfer comment: Improves with practice, but initially comes up with legs thrust back against the chair needing stability assist for safety  Ambulation/Gait Ambulation/Gait assistance: Min guard;Min assist Ambulation Distance (Feet): 900 Feet Assistive device: None Gait Pattern/deviations: Step-through pattern Gait velocity: able to change speed appreciably, but clearly working harder to maintain control Gait velocity interpretation: at or above normal speed for age/gender General Gait Details: generally with stable hemiparetic gait.  steady to mildly unsteady that has a tendency to degrade with fatigue  see DGI   Stairs Stairs: Yes Stairs assistance: Supervision Stair Management: One rail Right Number of Stairs: 9 General stair comments: instructed in safest technique  Wheelchair Mobility    Modified Rankin (Stroke Patients Only) Modified Rankin (Stroke Patients Only) Pre-Morbid Rankin Score: No symptoms Modified Rankin: No significant disability      Balance Overall balance assessment: Needs assistance Sitting-balance support: No upper extremity supported Sitting balance-Leahy Scale: Normal     Standing balance support: No upper extremity supported Standing balance-Leahy Scale: Good                      Cognition Arousal/Alertness: Awake/alert Behavior During Therapy: WFL for tasks assessed/performed Overall Cognitive Status: Within Functional Limits for tasks assessed                      Exercises      General Comments        Pertinent Vitals/Pain Pain Assessment: No/denies pain    Home Living                      Prior Function            PT Goals (current goals can now be found in the care plan section) Acute Rehab PT Goals Patient Stated Goal: to get back to normal PT Goal Formulation: With patient Time For Goal Achievement: 11/22/15 Potential to Achieve Goals: Good Progress towards PT goals: Progressing toward goals    Frequency    Min 4X/week      PT Plan Current plan remains appropriate    Co-evaluation             End of Session   Activity Tolerance: Patient tolerated treatment well Patient left: in bed;with call bell/phone within reach;with family/visitor present     Time: [id number] PT Time Calculation (min) (ACUTE ONLY): 39 min  Charges:  $Gait Training: 23-37 mins $Therapeutic Activity: 8-22 mins  G Codes:      Jamari Moten, Tessie Fass 11/17/2015, 3:03 PM 11/17/2015  Donnella Sham, PT [phone]  (pager)

## 2015-11-17 NOTE — Progress Notes (Signed)
Discharge instructions reviewed with patient/family. All questions answered at this time. Transport home by family.   Chatham Howington, RN 

## 2015-11-17 NOTE — Discharge Summary (Signed)
Physician Discharge Summary  Christian Jennings [id number] DOB: 1950-08-01 DOA: 11/14/2015  PCP: No PCP Per Patient  Admit date: 11/14/2015 Discharge date: 11/17/2015  Admitted From: Home Disposition:  Home  Recommendations for Outpatient Follow-up:  1. Follow up with PCP Dr. Josetta Huddle in 1-2 weeks 2. Follow up with Cardiology Dr. Percival Spanish as an outpatient in 1 week 3. Follow up with [facility] in 6 weeks 4. PT and OT as an [place]: No - Outpatient PT and OT Equipment/Devices: None  Discharge Condition: Stable and Improved CODE STATUS: FULL Diet recommendation: Heart Healthy Low Salt  Brief/Interim Summary: The patient is a very pleasant 65 yo Caucasian Male with a no real PMH (as he has not seen a physician in over 20 years) except Left eye surgery who presented to [facility] with Left Sided weakness. He noticed symptoms starting on Tuesday when he was playing tennis and Wednesday he woke up and noticed some dizziness, similar to previous episodes of vertigo but no chest discomfort, chest pain, shortness of breath, syncope, exertional dyspnea. He also continued to have left-sided weakness (couldn'tshampoo his hair as well with his left hand, left leg felt clumsier). He presented to urgent care who ultimately referred him to the ER where he was worked up and underwent CT Scan without Contrast as well as MRI and MRA of Head without Contrast. Impression of MRI/MRA showed an acute/early subacute infarction and Right Mid Coronoa Radiata with Foci extending into the lentiform nucleus, posterior limb of internal capsule, and cuadate body with no hemorrhage. He was admitted and Neurology was consulted. Yesterday Cardiology was consulted to follow his Atrial Fibrillation with RVR to make any additional recommendations. Patient was deemed medically stable and was able to be D/C'd Home and will follow up with Primary Care, Cardiology, and Neurology.   Discharge  Diagnoses:  Principal Problem:   Acute ischemic stroke [facility]) Active Problems:   Atrial fibrillation with rapid ventricular response (HCC)   Essential hypertension   Hyperlipidemia   PAF (paroxysmal atrial fibrillation) (HCC)   HTN (hypertension)   CVA (cerebral vascular accident) ([place])  ASSESSMENTS  1. Acute/Subacute Right Mid Corona Radiata CVA with Foci extending into Right Lentiform Nucleus, Posterior Limb of Internal Capsule, and Caudate body likely Embolic in Nature from Atrial Fibrillation with RVR. 2. Paroxysmal Atrial Fibrillation with RVR with intermittent spontaneous conversion to NSR 3. Hypertension 4. Hyperlipidemia 5. Suspected Hypothyroidism  PLAN  1. Acute/Subacute Right Mid Corona Radiata CVA with Foci extending into Right Lentiform Nucleus, Posterior Limb of Internal Capsule, and Caudate body likely Embolic in Nature from Atrial Fibrillation with RVR. -Neurology Stroke Team Consulted and Appreciated Recommendations. Discussed Case with Dr. Erlinda Hong -CT Head showed No acute abnormality. Mild appearing chronic microvascular ischemic change. -MRI/MRA of Head showed Acute/early subacute infarction and right mid corona radiata with foci extending into right lentiform nucleus, posterior limb of internal capsule, and caudate body. No hemorrhage identified.No occlusion, aneurysm, or high-grade stenosis of the circle of Willis is identified. Areas of mild stenosis and vessel irregularity in the anterior and posterior circulation probably representsintracranial atherosclerosis. -Carotid Dopplers Done and pending to be Read by Radiologist; Preliminary Report shows 1-39% ICA plaquing. Vertebral artery flow is antegrade. -Transthoracic Echocardiogram showed Left ventricle: The cavity size was normal. Wall thickness wasnormal. Systolic function was vigorous. The estimated ejectionfraction was in the range of 65% to 70%. The study was not technically sufficient to allow evaluation of LV  diastolic dysfunction due to atrial fibrillation.  Aortic valve: There was trivial regurgitation. Valve area (VTI): 2.7 cm^2. Valve area (Vmax): 2.46 cm^2.Left atrium: The atrium was mildly dilated.  -Lipid Panel showed Cholesterol of 291, HDL of 56, LDL of 216, TG of 96 -HbA1c was 5.3 -C/w Apixaban 5 mg po BID; ASA 325 mg po D/C'd by Neurology -Follow up with PT and OT as an Outpatient  2. Paroxysmal Atrial Fibrillation with RVR with intermittent spontaneous conversion to NSR -Previously on Cardizem gtt -CHADSVASC - 4 because of Age, HTN, and CVA -ECHO showed  Left ventricle: The cavity size was normal. Wall thickness wasnormal. Systolic function was vigorous. The estimated ejectionfraction was in the range of 65% to 70%. The study was not technically sufficient to allow evaluation of LV diastolic dysfunction due to atrial fibrillation. Aortic valve: There was trivial regurgitation. Valve area (VTI): 2.7 cm^2. Valve area (Vmax): 2.46 cm^2.Left atrium: The atrium was mildly dilated.  -Initiated Anticoagulation with Apixiban 5 mg po BID -C/w Metoprolol 25 mg po BID   -Consulted Cardiology Dr. Percival Spanish for evaluation; Appreciate additional recc's -Follow up with Cardiology as an outpatient.  3. Hypertension -Allowed for Permissive HTN.  -Continue with Metoprolol 25 mg po BID -Can have Amublatory Blood Pressure Monitoring and likely be placed on Amlodipine  4. Hyperlipidemia -Lipid Panel showed Cholesterol of 291, HDL of 56, LDL of 216, TG of 96 -Patient was started on Atorvastatin 80 mg po qHS  5. Subclinical Hypothyroidism -TSH was 6.379; Obtaining Free T4 0.99  -Recheck TSH and Free T4 as an outpatient and discuss with PCP whether to start medication  Discharge Instructions  Discharge Instructions    Ambulatory referral to Neurology    Complete by:  As directed    Follow up with NP Cecille Rubin at [facility] in about 6 weeks. Thanks.   Ambulatory referral to Occupational  Therapy    Complete by:  As directed    Best contact numbers: [phone] or [phone]   Ambulatory referral to Physical Therapy    Complete by:  As directed    Best contact numbers: [phone] or [phone]   Call MD for:  difficulty breathing, headache or visual disturbances    Complete by:  As directed    Call MD for:  persistant dizziness or light-headedness    Complete by:  As directed    Call MD for:  persistant nausea and vomiting    Complete by:  As directed    Diet - low sodium heart healthy    Complete by:  As directed    Discharge instructions    Complete by:  As directed    Take all Medications as prescribed. Follow up with PCP, Cardiology, and with [facility]. If symptoms change or worsen please come back to ER for Evaluation.   Increase activity slowly    Complete by:  As directed        Medication List    STOP taking these medications   ALPHA LIPOIC ACID PO   CINNAMON PO   co-enzyme Q-10 50 MG capsule   L-ARGININE PO   Lecithin Gran   NATTOKINASE PO   NON FORMULARY   NON FORMULARY   NON FORMULARY   NON FORMULARY   NON FORMULARY   NON FORMULARY   NON FORMULARY   NON FORMULARY   UNABLE TO FIND   UNABLE TO FIND   Vitamin D-3 1000 units Caps     TAKE these medications   apixaban 5 MG Tabs tablet Commonly known as:  ELIQUIS Take 1 tablet (5 mg total) by mouth 2 (two) times daily.   atorvastatin 80 MG tablet Commonly known as:  LIPITOR Take 1 tablet (80 mg total) by mouth daily at 6 PM.   metoprolol tartrate 25 MG tablet Commonly known as:  LOPRESSOR Take 1 tablet (25 mg total) by mouth 2 (two) times daily.      Follow-up Information    MARTIN,NANCY CAROLYN, NP Follow up in 6 week(s).   Specialty:  Family Medicine Contact information: [address] [place] [address] [phone]        Henrine Screws, MD. Schedule an appointment as soon as possible for a visit in 1  week(s).   Specialty:  Internal Medicine Why:  Follow Up for Hospitalizationa and Establishment as a Patient Contact information: [address] [place] [address] [phone]        Minus Breeding, MD Follow up in 1 week(s).   Specialty:  Cardiology Why:  Follow Up for A Fib.  Contact information: [place] [address] [phone]          No Known Allergies  Consultations: - Neurology Stroke Team Dr. Erlinda Hong - Cardiology Dr. Percival Spanish  Procedures/Studies: Ct Head Wo Contrast  Result Date: 11/14/2015 CLINICAL DATA:  Left facial droop, left arm weakness and unsteady gait for 4 days. EXAM: CT HEAD WITHOUT CONTRAST TECHNIQUE: Contiguous axial images were obtained from the base of the skull through the vertex without intravenous contrast. COMPARISON:  None. FINDINGS: Brain: Mild appearing chronic microvascular ischemic change is seen. No evidence of acute abnormality including infarct, hemorrhage, mass lesion, mass effect the core, midline shift or abnormal extra-axial fluid collection. No hydrocephalus or pneumocephalus. Vascular: Atherosclerotic vascular disease is identified. Skull: Intact. Sinuses/Orbits: Unremarkable. Other: None. IMPRESSION: No acute abnormality. Mild appearing chronic microvascular ischemic change. Atherosclerosis. Electronically Signed   By: Inge Rise M.D.   On: 11/14/2015 11:18   Mr Brain Wo Contrast  Result Date: 11/15/2015 CLINICAL DATA:  65 y/o M; no known past medical history presenting with left-sided weakness. EXAM: MRI HEAD WITHOUT CONTRAST MRA HEAD WITHOUT CONTRAST TECHNIQUE: Multiplanar, multiecho pulse sequences of the brain and surrounding structures were obtained without intravenous contrast. Angiographic images of the head were obtained using MRA technique without contrast. COMPARISON:  11/14/2015 CT head. FINDINGS: MRI HEAD FINDINGS Brain: Focus of restricted diffusion within the right body of  caudate and mid corona radiata with additional small foci in the right putamin and posterior limb of internal capsule mild associated T2 FLAIR hyperintensity consistent with acute/ early subacute infarction. No additional diffusion abnormality is identified. Scattered nonspecific foci of T2 FLAIR hyperintensity in subcortical and periventricular white matter with frontal parietal predominance are consistent with mild chronic microvascular ischemic changes. No abnormal susceptibility hypointensity to indicate intracranial hemorrhage. No extra-axial collection. Vascular: See below. Skull and upper cervical spine: Normal marrow signal. Sinuses/Orbits: Negative. Other: None. MRA HEAD FINDINGS Bilateral internal carotid arteries, middle cerebral arteries, and anterior cerebral arteries are patent. Mild narrowing of proximal left M2 inferior division. No occlusion, aneurysm, or significant stenosis is identified. Left dominant vertebrobasilar system. Bilateral V4 segments, bilateral PICA, the basilar artery, bilateral AICA, bilateral SCA, and bilateral posterior cerebral arteries are patent. Mild irregularity of the left V4 segment and right proximal PCA probably reflects underlying atherosclerosis with mild associated stenosis. No occlusion, aneurysm, or significant stenosis is identified. Patent anterior communicating artery. Large right posterior communicating artery with small right P1 segment consistent  with persistent fetal circulation. No left posterior communicating artery identified, hypoplastic or absent. IMPRESSION: 1. Acute/early subacute infarction and right mid corona radiata with foci extending into right lentiform nucleus, posterior limb of internal capsule, and caudate body. No hemorrhage identified. 2. No occlusion, aneurysm, or high-grade stenosis of the circle of Willis is identified. Areas of mild stenosis and vessel irregularity in the anterior and posterior circulation probably represents  intracranial atherosclerosis. These results will be called to the ordering clinician or representative by the Radiologist Assistant, and communication documented in the PACS or zVision Dashboard. Electronically Signed   By: Kristine Garbe M.D.   On: 11/15/2015 00:38   Mr [name] Head/brain [id number] Cm  Result Date: 11/15/2015 CLINICAL DATA:  65 y/o M; no known past medical history presenting with left-sided weakness. EXAM: MRI HEAD WITHOUT CONTRAST MRA HEAD WITHOUT CONTRAST TECHNIQUE: Multiplanar, multiecho pulse sequences of the brain and surrounding structures were obtained without intravenous contrast. Angiographic images of the head were obtained using MRA technique without contrast. COMPARISON:  11/14/2015 CT head. FINDINGS: MRI HEAD FINDINGS Brain: Focus of restricted diffusion within the right body of caudate and mid corona radiata with additional small foci in the right putamin and posterior limb of internal capsule mild associated T2 FLAIR hyperintensity consistent with acute/ early subacute infarction. No additional diffusion abnormality is identified. Scattered nonspecific foci of T2 FLAIR hyperintensity in subcortical and periventricular white matter with frontal parietal predominance are consistent with mild chronic microvascular ischemic changes. No abnormal susceptibility hypointensity to indicate intracranial hemorrhage. No extra-axial collection. Vascular: See below. Skull and upper cervical spine: Normal marrow signal. Sinuses/Orbits: Negative. Other: None. MRA HEAD FINDINGS Bilateral internal carotid arteries, middle cerebral arteries, and anterior cerebral arteries are patent. Mild narrowing of proximal left M2 inferior division. No occlusion, aneurysm, or significant stenosis is identified. Left dominant vertebrobasilar system. Bilateral V4 segments, bilateral PICA, the basilar artery, bilateral AICA, bilateral SCA, and bilateral posterior cerebral arteries are patent. Mild irregularity of  the left V4 segment and right proximal PCA probably reflects underlying atherosclerosis with mild associated stenosis. No occlusion, aneurysm, or significant stenosis is identified. Patent anterior communicating artery. Large right posterior communicating artery with small right P1 segment consistent with persistent fetal circulation. No left posterior communicating artery identified, hypoplastic or absent. IMPRESSION: 1. Acute/early subacute infarction and right mid corona radiata with foci extending into right lentiform nucleus, posterior limb of internal capsule, and caudate body. No hemorrhage identified. 2. No occlusion, aneurysm, or high-grade stenosis of the circle of Willis is identified. Areas of mild stenosis and vessel irregularity in the anterior and posterior circulation probably represents intracranial atherosclerosis. These results will be called to the ordering clinician or representative by the Radiologist Assistant, and communication documented in the PACS or zVision Dashboard. Electronically Signed   By: Kristine Garbe M.D.   On: 11/15/2015 00:38    ECHOCARDIOGRAM: - Left ventricle: The cavity size was normal. Wall thickness was   normal. Systolic function was vigorous. The estimated ejection   fraction was in the range of 65% to 70%. The study was not   technically sufficient to allow evaluation of LV diastolic   dysfunction due to atrial fibrillation. - Aortic valve: There was trivial regurgitation. Valve area (VTI):   2.7 cm^2. Valve area (Vmax): 2.46 cm^2. - Left atrium: The atrium was mildly dilated. - Technically adequate study.   CAROTID U/S: Preliminary report:  1-39% ICA plaquing.  Vertebral artery flow is antegrade.  Subjective: Patient was seen and examined this  AM and he was sitting in his chair. Felt well and wanted to go home. Had a lot of questions which were answered to his and his wife's satisfaction. Denied Cp, SOB, N/V, Lightheadedness or dizziness. No  other complaints or concerns.   Discharge Exam: Vitals:   11/17/15 0500 11/17/15 0949  BP: 119/67 (!) 153/99  Pulse: 64 68  Resp: 16 18  Temp: 98.5 F (36.9 C) 97.9 F (36.6 C)   Vitals:   11/16/15 2103 11/17/15 0100 11/17/15 0500 11/17/15 0949  BP: (!) 180/119 (!) 141/106 119/67 (!) 153/99  Pulse: (!) 108 62 64 68  Resp: 17 18 16 18   Temp: 97.9 F (36.6 C) 98.4 F (36.9 C) 98.5 F (36.9 C) 97.9 F (36.6 C)  TempSrc: Oral Oral Oral Oral  SpO2: 98% 96% 98% 100%  Weight:      Height:        General: Pt is alert, awake, not in acute distress Cardiovascular: RRR, S1/S2 +, no rubs, no gallops Respiratory: CTA bilaterally, no wheezing, no rhonchi Abdominal: Soft, NT, ND, bowel sounds + Extremities: no edema, no cyanosis  The results of significant diagnostics from this hospitalization (including imaging, microbiology, ancillary and laboratory) are listed below for reference.    Microbiology: No results found for this or any previous visit (from the past 240 hour(s)).   Labs: BNP (last 3 results) No results for input(s): BNP in the last 8760 hours. Basic Metabolic Panel:  Recent Labs Lab 11/14/15 1029 11/15/15 0607 11/16/15 0629 11/17/15 0620  NA 139 137 140 142  K 3.9 3.5 3.8 3.6  CL 105 103 106 110  CO2 24 24 24 26   GLUCOSE 133* 88 92 102*  BUN 19 16 23* 20  CREATININE 1.08 1.00 1.23 1.07  CALCIUM 9.6 9.5 9.4 9.2  MG  --  2.5* 2.3 2.4  PHOS  --  3.2 3.0  --    Liver Function Tests:  Recent Labs Lab 11/14/15 1029 11/15/15 0607 11/16/15 0629 11/17/15 0620  AST 27 26 26 21   ALT 31 28 26 25   ALKPHOS 60 53 48 46  BILITOT 1.4* 1.5* 2.0* 1.4*  PROT 8.4* 7.1 7.0 6.6  ALBUMIN 5.0 4.4 4.0 3.8   No results for input(s): LIPASE, AMYLASE in the last 168 hours. No results for input(s): AMMONIA in the last 168 hours. CBC:  Recent Labs Lab 11/14/15 1029 11/15/15 0607 11/16/15 0629 11/17/15 0620  WBC 10.9* 11.0* 9.0 8.5  NEUTROABS  --  7.2 5.3 5.3   HGB 17.8* 17.0 16.6 15.5  HCT 51.3 49.3 48.1 46.1  MCV 86.9 89.6 88.7 89.5  PLT 201 166 165 169   Cardiac Enzymes:  Recent Labs Lab 11/14/15 1029  TROPONINI <0.03   BNP: Invalid input(s): POCBNP CBG:  Recent Labs Lab 11/15/15 0640 11/15/15 1142 11/15/15 1612 11/15/15 2118 11/16/15 0602  GLUCAP 109* 123* 105* 100* 96   D-Dimer No results for input(s): DDIMER in the last 72 hours. Hgb A1c  Recent Labs  11/15/15 0607 11/16/15 0629  HGBA1C 5.2 5.3   Lipid Profile  Recent Labs  11/15/15 0607  CHOL 291*  HDL 56  LDLCALC 216*  TRIG 96  CHOLHDL 5.2   Thyroid function studies  Recent Labs  11/15/15 0607  TSH 6.379*   Anemia work up No results for input(s): VITAMINB12, FOLATE, FERRITIN, TIBC, IRON, RETICCTPCT in the last 72 hours. Urinalysis No results found for: COLORURINE, APPEARANCEUR, [place], [place], [place], [place], [place], [place], [place], [place], NITRITE, LEUKOCYTESUR  Sepsis Labs Invalid input(s): PROCALCITONIN,  WBC,  LACTICIDVEN Microbiology No results found for this or any previous visit (from the past 240 hour(s)).  Time coordinating discharge: Greater than 30 minutes  SIGNED:  Kerney Elbe, DO [facility] 11/17/2015, 1:34 PM Pager [phone]  If 7PM-7AM, please contact night-coverage [email or web address] Password [email or web address]

## 2015-11-17 NOTE — Care Management Note (Signed)
Case Management Note  Patient Details  Name: Christian Jennings MRN: [id number] Date of Birth: Aug 29, 1950  Subjective/Objective:   Pt admitted with CVA. He is from home with his spouse.                Action/Plan: CM consulted for medication assistance. Pt has medicare A and B, [employer] but no drug coverage. Pt given SHIP information to call and get advice on changing his Medicare to a plan with drug coverage. Pt given coupon for his cholesterol medication and his metoprolol is on $4 list at [facility]. Pt also given 30 day free card and $10 co pay card for Eliquis.  Pt with orders for outpatient therapy. Patient interested in attending the [facility]. Orders placed in EPIC and information on the AVS.   Expected Discharge Date:                  Expected Discharge Plan:  Home/Self Care  In-House Referral:     Discharge planning Services  CM Consult  Post Acute Care Choice:    Choice offered to:     DME Arranged:    DME Agency:     HH Arranged:    [place] Agency:     Status of Service:  Completed, signed off  If discussed at [employer] of Stay Meetings, dates discussed:    Additional Comments:  Pollie Friar, RN 11/17/2015, 4:49 PM

## 2015-11-17 NOTE — Care Management Important Message (Signed)
Important Message  Patient Details  Name: Christian Jennings MRN: [id number] Date of Birth: 12/29/1950   Medicare Important Message Given:  Yes    [name] 11/17/2015, 1:20 PM

## 2015-11-18 LAB — HEMOGLOBIN A1C
HEMOGLOBIN A1C: 5.2 % (ref 4.8–5.6)
MEAN PLASMA GLUCOSE: 103 mg/dL

## 2015-11-19 ENCOUNTER — Ambulatory Visit: Payer: Medicare Other | Attending: Internal Medicine | Admitting: Occupational Therapy

## 2015-11-19 DIAGNOSIS — R2689 Other abnormalities of gait and mobility: Secondary | ICD-10-CM

## 2015-11-19 DIAGNOSIS — R41842 Visuospatial deficit: Secondary | ICD-10-CM | POA: Diagnosis not present

## 2015-11-19 DIAGNOSIS — R278 Other lack of coordination: Secondary | ICD-10-CM | POA: Diagnosis not present

## 2015-11-19 DIAGNOSIS — M6281 Muscle weakness (generalized): Secondary | ICD-10-CM | POA: Diagnosis not present

## 2015-11-19 DIAGNOSIS — R2681 Unsteadiness on feet: Secondary | ICD-10-CM | POA: Diagnosis not present

## 2015-11-19 LAB — VAS US CAROTID
LCCADDIAS: -21 cm/s
LCCADSYS: -65 cm/s
LCCAPDIAS: 14 cm/s
LEFT ECA DIAS: -8 cm/s
LEFT VERTEBRAL DIAS: -14 cm/s
LICADSYS: -80 cm/s
LICAPDIAS: -14 cm/s
Left CCA prox sys: 61 cm/s
Left ICA dist dias: -31 cm/s
Left ICA prox sys: -32 cm/s
RCCADSYS: -104 cm/s
RCCAPDIAS: -16 cm/s
RCCAPSYS: -69 cm/s
RIGHT ECA DIAS: -29 cm/s
RIGHT VERTEBRAL DIAS: -37 cm/s

## 2015-11-20 NOTE — Therapy (Signed)
[facility] [address] [place], [place], [address] Phone: [phone]   Fax:  [phone]  Occupational Therapy Evaluation  Patient Details  Name: Christian Jennings MRN: [id number] Date of Birth: [birth year] Referring Provider: Dr. Erlinda Hong  Encounter Date: 11/19/2015      OT End of Session - 11/20/15 1253    Visit Number 1   Number of Visits 17   Date for OT Re-Evaluation 01/17/16   Authorization Type Medicare    Authorization - Visit Number 1   Authorization - Number of Visits 10   OT Start Time [id number]   OT Stop Time 1015   OT Time Calculation (min) 40 min   Activity Tolerance Patient tolerated treatment well   Behavior During Therapy [facility] for tasks assessed/performed      Past Medical History:  Diagnosis Date  . CVA (cerebral vascular accident) ([place])   . HLD (hyperlipidemia)   . HTN (hypertension)   . PAF (paroxysmal atrial fibrillation) ([place])     Past Surgical History:  Procedure Laterality Date  . EYE SURGERY     As child for muscle issue  . EYE SURGERY     Cosmetic for drooping eyelid    There were no vitals filed for this visit.      Subjective Assessment - 11/19/15 0941    Subjective  Pt reports that he had not seen anMD in 20 yrs, pt reports that he thinks CVA was a result of A-fib and HTN   Pertinent History see Epic   Patient Stated Goals 100% recovery   Currently in Pain? No/denies   Pain Score 0-No pain           [facility] OT Assessment - 11/20/15 0001      Assessment   Diagnosis CVA   Referring Provider Dr. Erlinda Hong   Prior Therapy acute OT/PT     Precautions   Precautions Fall     Balance Screen   Has the patient fallen in the past 6 months No   Has the patient had a decrease in activity level because of a fear of falling?  No   Is the patient reluctant to leave their home because of a fear of falling?  No     Home  Environment   Family/patient expects to be discharged to: Private  residence   Type of [place]   Level of [place] Retired  retired Museum/gallery conservator     ADL   Eating/Feeding Modified independent   Grooming Supervision/safety   Upper Body Bathing Minimal assistance   Lower Body Bathing Moderate assistance   Upper Body Dressing Minimal assistance   Lower Body Dressing Maximal assistance   [place] Transfer Minimal assistance   ADL comments Pt requires increased time and must avoid falls due to blood thinning meds.     IADL   Shopping Needs to be accompanied on any shopping trip   Meal Prep Needs to have meals prepared and served  cooked prior    Medication Management Is responsible for taking medication in correct dosages at correct time   Financial Management --  Performed with his wife yesterday     Mobility   Mobility Status Independent     Written Expression   Dominant Hand Right  Vision - History   Baseline Vision Wears glasses only for reading   Visual History Other (comment)   Patient Visual Report --  hx of eye surgery as a child left eye   Additional Comments Pt /wife report that ptosis in left eye is just a little worse but the visual changes have been long standing     Vision Assessment   Comment decreased alignmnent, however likely congenital      Cognition   Overall Cognitive Status Cognition to be further assessed in functional context PRN   Behaviors Other (comment)  Pt moves quickly with some tasks,      Sensation   Light Touch Appears Intact  per gross assessment     Coordination   Gross Motor Movements are Fluid and Coordinated Yes   Fine Motor Movements are Fluid and Coordinated Yes   9 Hole Peg Test Right;Left   Right 9 Hole Peg Test unable for LUE   Box and Blocks RUE 63 blocks, LUE 19 blocks    Coordination impaired for LUE     ROM / Strength   AROM / PROM / Strength AROM;Strength     AROM   Overall AROM  Within functional limits for tasks performed  decreased overallLUE control   Overall AROM Comments left shoulder flexion 120, LUE abduction 130     Strength   Overall Strength Deficits   Overall Strength Comments Pt moves LUE against gravity however demonstrates significantly decreased LUE gross motor contol  RUE grossly WFLS                          OT Short Term Goals - 11/20/15 1327      OT SHORT TERM GOAL #1   Title I with inital HEP   Time 4   Status New     OT SHORT TERM GOAL #2   Title Pt will improve LUE functional use as evidenced by increasing LUE box and blocks score to 24 blocks.   Baseline RUE 63 blocks, LUE 19 blocks   Time 4   Period Weeks   Status New     OT SHORT TERM GOAL #3   Title Pt will perform all UB bathing and dressing with supervision/ set up.   Time 4   Period Weeks   Status New     OT SHORT TERM GOAL #4   Title Pt will perfrom lower body bathing and dressing with no more than min A.   Time 4   Period Weeks   Status New     OT SHORT TERM GOAL #5   Title Pt will demonstrate ability to safely navigate a busy environment and locate items with 95% or better acccuracy.   Time 4   Period Weeks   Status New           OT Long Term Goals - 11/20/15 1332      OT LONG TERM GOAL #1   Title Pt will demonstrate ability to perform LUE  9 hole peg test in 2 mins or less.   Time 8   Period Weeks   Status New     OT LONG TERM GOAL #2   Title Pt will perform all basic ADLS with supervision.   Time 8   Period Weeks   Status New     OT LONG TERM GOAL #3   Title pt will perform light home management / cooking with supervision demonstrating good safety  awareness.   Time 8   Period Weeks   Status New     OT LONG TERM GOAL #4   Title Pt will demonstrate ability to retrieve 3 lbs weight from ovehead shelf with  LUE without dropping.   Time 8   Period Weeks   Status New     OT LONG TERM GOAL #5   Title Pt will demonstrate ability to carry a plate of food in left hand without spills or drops.   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 Pt presents with left sided weakness, decreased coordination, visuospatial deficits, decreased balance and functional mobility which impedes performance of ADLS/IADLS. Pt can benefit from skilled occupational therapy to maximize functional independence with ADLs/IADLS and mainatian quality of life. Pt was active prior to CVa and enjoyed tennis.    Rehab Potential Good   OT Frequency 2x / week  plus eval   OT Duration 8 weeks   OT Treatment/Interventions Self-care/ADL training;Moist Heat;Fluidtherapy;DME and/or AE instruction;Splinting;Patient/family education;Balance training;Therapeutic exercises;Ultrasound;Therapeutic exercise;Therapeutic activities;Cognitive remediation/compensation;Passive range of motion;Functional Mobility Training;Neuromuscular education;Electrical Stimulation;[place];Energy conservation;Manual Therapy;Visual/perceptual remediation/compensation   Plan HEP(ball exercises in supine for shoulder flexion), coordination activities   Consulted and Agree with Plan of Care Family member/caregiver;Patient   Family Member Consulted wife      Patient will benefit from skilled therapeutic intervention in order to improve the following deficits and impairments:  Abnormal gait, Decreased coordination, Decreased range of motion, Difficulty walking, Decreased endurance, Decreased activity tolerance, Impaired UE functional use, Decreased knowledge of use of DME, Decreased balance, Decreased cognition, Decreased mobility, Decreased strength, Impaired perceived  functional ability, Impaired vision/preception  Visit Diagnosis: Muscle weakness (generalized)  Unsteadiness on feet  Other lack of coordination  Visuospatial deficit  Other abnormalities of gait and mobility      G-Codes - 11-20-2015 1630    Functional Assessment Tool Used box/ blocks RUE 63 blocks, LUE 19 blocks, clinical impressions   Functional Limitation Carrying, moving and handling objects   Carrying, Moving and Handling Objects Current Status [id number]) At least 40 percent but less than 60 percent impaired, limited or restricted   Carrying, Moving and Handling Objects Goal Status [id number]) At least 1 percent but less than 20 percent impaired, limited or restricted      Problem List Patient Active Problem List   Diagnosis Date Noted  . PAF (paroxysmal atrial fibrillation) ([place])   . HTN (hypertension)   . CVA (cerebral vascular accident) ([place])   . Hyperlipidemia   . Acute ischemic stroke ([place]) 11/14/2015  . Atrial fibrillation with rapid ventricular response ([place]) 11/14/2015  . Essential hypertension 11/14/2015    RINE,KATHRYN 11/20/2015, 1:39 PM Theone Murdoch, OTR/L [phone] Phone: [phone] 1:45 PM 11/20/15 [facility] [address] [place], [place], [address] Phone: [phone]   Fax:  [phone]  Name: Christian Jennings MRN: [id number] Date of Birth: [birth year]

## 2015-11-21 ENCOUNTER — Ambulatory Visit: Payer: Medicare Other | Admitting: Physical Therapy

## 2015-11-21 DIAGNOSIS — R2689 Other abnormalities of gait and mobility: Secondary | ICD-10-CM

## 2015-11-21 DIAGNOSIS — R2681 Unsteadiness on feet: Secondary | ICD-10-CM

## 2015-11-21 DIAGNOSIS — M6281 Muscle weakness (generalized): Secondary | ICD-10-CM

## 2015-11-21 DIAGNOSIS — R41842 Visuospatial deficit: Secondary | ICD-10-CM | POA: Diagnosis not present

## 2015-11-21 DIAGNOSIS — R278 Other lack of coordination: Secondary | ICD-10-CM | POA: Diagnosis not present

## 2015-11-22 NOTE — Therapy (Signed)
[facility] [address] [place], [place], [address] Phone: [phone]   Fax:  [phone]  Physical Therapy Evaluation  Patient Details  Name: Christian Jennings MRN: [id number] Date of Birth: 15-May-1950 Referring Provider: Rosalin Hawking  Encounter Date: 11/21/2015      PT End of Session - 11/22/15 1501    Visit Number 1   Number of Visits 17   Date for PT Re-Evaluation 01/20/16   Authorization Type Medicare primary/BCBS secondary   PT Start Time 0933   PT Stop Time 1017   PT Time Calculation (min) 44 min   Activity Tolerance Patient tolerated treatment well   Behavior During Therapy [facility] for tasks assessed/performed      Past Medical History:  Diagnosis Date  . CVA (cerebral vascular accident) ([place])   . HLD (hyperlipidemia)   . HTN (hypertension)   . PAF (paroxysmal atrial fibrillation) ([place])     Past Surgical History:  Procedure Laterality Date  . EYE SURGERY     As child for muscle issue  . EYE SURGERY     Cosmetic for drooping eyelid    There were no vitals filed for this visit.       Subjective Assessment - 11/21/15 0935    Subjective Pt is a 65 year old male who presents to OP PT following CVA with L sided weakness.  CVA occurred last Tuesday while throwing and serving tennis balls. Pt did not present to ED until Friday 11/14/15   Patient is accompained by: Family member  wife-Kathy   Patient Stated Goals Pt's goal is to get back to ultimate goal of 100% recovery.   Currently in Pain? No/denies            [facility] PT Assessment - 11/21/15 [id number]      Assessment   Medical Diagnosis CVA   Referring Provider Rosalin Hawking   Onset Date/Surgical Date 11/11/15     Precautions   Precautions Fall  on blood thinners     Balance Screen   Has the patient fallen in the past 6 months No   Has the patient had a decrease in activity level because of a fear of falling?  No   Is the patient reluctant to  leave their home because of a fear of falling?  No     Home Ecologist residence   Living Arrangements Spouse/significant other   Available Help at Discharge Family   Type of [place] to enter   Entrance Stairs-Number of Steps 6   Entrance Stairs-Rails Can reach both  [place] Two level;Bed/bath upstairs   Alternate Level Stairs-Rails Left     Prior Function   Level of [place] Retired  [employer]   Leisure Enjoys playing tennis, walking, playing golf     Cognition   Behaviors Other (comment)  Slight L inattention-wife reports grazing L shoulder at time     Observation/Other Assessments   Focus on Therapeutic Outcomes (FOTO)  Functional Intake Status:  47; Neuro QOL:  36.9%     Sensation   Light Touch Appears Intact     ROM / Strength   AROM / PROM / Strength Strength     Strength   Overall Strength Deficits   Overall Strength Comments Trunk lean posteriorly with lower extremity MMT   Strength Assessment Site Hip;Knee;Ankle   Right/Left Hip Right;Left   Right  Hip Flexion 4/5   Left Hip Flexion 3+/5   Right/Left Knee Right;Left   Right Knee Flexion 4/5   Right Knee Extension 4/5   Left Knee Flexion 3+/5   Left Knee Extension 3+/5   Right/Left Ankle Right;Left   Right Ankle Dorsiflexion 4/5   Left Ankle Dorsiflexion 3+/5     Transfers   Transfers Sit to Stand;Stand to Sit   Sit to Stand 6: Modified independent (Device/Increase time)   Stand to Sit 6: Modified independent (Device/Increase time)     Ambulation/Gait   Ambulation/Gait Yes   Ambulation/Gait Assistance 5: Supervision   Ambulation/Gait Assistance Details Decreased environmental scanning, and pt veers close to obstacles, doorways on L side   Ambulation Distance (Feet) 200 Feet   Assistive device None   Gait Pattern Step-through pattern;Decreased arm swing - left;Decreased step length - left;Decreased stance  time - left;Decreased dorsiflexion - left;Left genu recurvatum;Decreased trunk rotation;Poor foot clearance - left  L foot catches with gait   Ambulation Surface Level;Indoor   Gait velocity 10.85 sec = 3.02 ft/sec   Stairs Yes   Stairs Assistance 5: Supervision   Stairs Assistance Details (indicate cue type and reason) wife reports patient negotiates steps at home with step-to pattern, with supervision   Stair Management Technique One rail Right;Alternating pattern;Forwards  needs cues for full foot placement on step   Number of Stairs 4   Height of Stairs 6     Standardized Balance Assessment   Standardized Balance Assessment Dynamic Gait Index;Timed Up and Go Test;Berg Balance Test     Berg Balance Test   Sit to Stand Able to stand  independently using hands   Standing Unsupported Able to stand safely 2 minutes   Sitting with Back Unsupported but Feet Supported on Floor or Stool Able to sit safely and securely 2 minutes   Stand to Sit Controls descent by using hands   Transfers Able to transfer safely, definite need of hands   Standing Unsupported with Eyes Closed Able to stand 10 seconds with supervision   Standing Ubsupported with Feet Together Able to place feet together independently and stand 1 minute safely   From Standing, Reach Forward with Outstretched Arm Can reach confidently >25 cm (10")   From Standing Position, Pick up Object from Floor Able to pick up shoe, needs supervision   From Standing Position, Turn to Look Behind Over each Shoulder Looks behind one side only/other side shows less weight shift   Turn 360 Degrees Able to turn 360 degrees safely but slowly   Standing Unsupported, Alternately Place Feet on Step/Stool Able to complete >2 steps/needs minimal assist   Standing Unsupported, One Foot in Front Able to take small step independently and hold 30 seconds   Standing on One Leg Unable to try or needs assist to prevent fall   Total Score 39   Berg comment:  Scores <45/56 are indicative of increased fall risk     Dynamic Gait Index   Level Surface Moderate Impairment  7.35   Change in Gait Speed Mild Impairment   Gait with Horizontal Head Turns Moderate Impairment   Gait with Vertical Head Turns Mild Impairment   Gait and Pivot Turn Mild Impairment  3.12   Step Over Obstacle Moderate Impairment   Step Around Obstacles Moderate Impairment   Steps Mild Impairment   Total Score 12   DGI comment: Scores <19/24 are indicative of increased fall risk.     Timed Up and Go Test  Normal TUG (seconds) 17.67   TUG Comments Scores >13.5 seconds are indicative of increased fall risk.                           PT Education - 11/22/15 1459    Education provided Yes   Education Details Discussed continued need for supervision with gait at home; discussed equal weightbearing with gait during transfers, equal step length with gait; slowed pace with gait for safety   Person(s) Educated Patient;Spouse   Methods Explanation;Demonstration;Tactile cues   Comprehension Verbalized understanding          PT Short Term Goals - 11/22/15 1509      PT SHORT TERM GOAL #1   Title Pt will be independent with HEP for strengthening, balance, and gait.  TARGET 12/22/15   Time 4   Period Weeks   Status New     PT SHORT TERM GOAL #2   Title Pt will improve Berg Balance score to at least 45/56 for decreased fall risk.   Time 4   Period Weeks   Status New     PT SHORT TERM GOAL #3   Title Pt will improve TUG score to less than or equal to 13.5 seconds for decreased fall risk.   Time 4   Period Weeks   Status New     PT SHORT TERM GOAL #4   Title Pt will negotiate at least 4 steps, -alternating step pattern with 1 handrail, modified independently.   Time 4   Period Weeks   Status New     PT SHORT TERM GOAL #5   Title Pt/family will verbalize understanding of CVA warning signs/symptoms.   Time 4   Period Weeks   Status New            PT Long Term Goals - 11/22/15 1511      PT LONG TERM GOAL #1   Title Pt/family will verbalize understanding of fall prevention within home environment.  TARGET 01/20/16   Time 8   Period Weeks   Status New     PT LONG TERM GOAL #2   Title Pt will improve Dynamic Gait Index score to at least 19/24 for decreased fall risk.   Time 8   Period Weeks   Status New     PT LONG TERM GOAL #3   Title Pt will improve gait velocity to at least 3.3 ft/sec for improved gait efficiency and safety.   Time 8   Period Weeks   Status New     PT LONG TERM GOAL #4   Title Pt will negotiate at least 4 steps, alternating pattern, no handrail, independently, for improved safety and efficiency with stairs.   Time 8   Period Weeks   Status New     PT LONG TERM GOAL #5   Title Pt will ambulate at least 1000 ft indoor and outdoor surfaces, modified independently, for improved outdoor gait/return to leisure activities.   Time 8   Period Weeks   Status New     Additional Long Term Goals   Additional Long Term Goals Yes     PT LONG TERM GOAL #6   Title Pt will improve Neuro QOL score on FOTO by at least 10% to demonstrate improved functional mobility and recovery.   Time 8   Period Weeks   Status New               Plan -  11/22/15 1502    Clinical Impression Statement Pt is a 65 year old gentleman with history of recent CVA, within the past 10 days, which caused L sided weakness.  In addition to decreased strength, patient presents with decreased balance, decreased independence with gait, decreased timing and coordination with gait, decreased safety awareness.  Pt is at increased fall risk per Berg, TUG, and DGI scores.  Prior to CVA, pt was independent, playing golf, tennis and walking for exercise on a regular basis.  Pt would benefit from skilled PT to address the above stated deficits to decrease fall risk, improve functional mobility, and encourage return to leisure and exercise  activities.   Rehab Potential Good   PT Frequency 2x / week   PT Duration 8 weeks  plus eval   PT Treatment/Interventions ADLs/Self Care Home Management;Functional mobility training;Stair training;Gait training;DME Instruction;Therapeutic activities;Therapeutic exercise;Balance training;Neuromuscular re-education;Patient/family education;Orthotic Fit/Training;Electrical Stimulation   PT Next Visit Plan Initiate HEP for lower extremity strengthening, neurore-ed to LLE (for improved gait pattern, decreased L knee recurvatum); stair negotiation, gait training (possibly with AD?)   Consulted and Agree with Plan of Care Patient;Family member/caregiver   Family Member Consulted wife      Patient will benefit from skilled therapeutic intervention in order to improve the following deficits and impairments:  Abnormal gait, Decreased balance, Decreased mobility, Decreased coordination, Decreased safety awareness, Decreased strength, Difficulty walking, Postural dysfunction, Impaired tone  Visit Diagnosis: Muscle weakness (generalized)  Other abnormalities of gait and mobility  Unsteadiness on feet      G-Codes - 12-01-2015 1515    Functional Assessment Tool Used FOTO 47; Neuro QOL 36.9%, Berg 39/56, TUG 17.67, DGI 12/24   Functional Limitation Mobility: Walking and moving around   Mobility: Walking and Moving Around Current Status [phone] At least 60 percent but less than 80 percent impaired, limited or restricted   Mobility: Walking and Moving Around Goal Status [phone]) At least 20 percent but less than 40 percent impaired, limited or restricted       Problem List Patient Active Problem List   Diagnosis Date Noted  . PAF (paroxysmal atrial fibrillation) ([place])   . HTN (hypertension)   . CVA (cerebral vascular accident) ([place])   . Hyperlipidemia   . Acute ischemic stroke ([place]) 11/14/2015  . Atrial fibrillation with rapid ventricular response ([place]) 11/14/2015  . Essential hypertension  11/14/2015    Zoee Heeney W. 11/22/2015, 3:17 PM  Frazier Butt., PT  [facility] [address] [place], [place], [address] Phone: [phone]   Fax:  [phone]  Name: Christian Jennings MRN: [id number] Date of Birth: 1951/01/30

## 2015-11-24 ENCOUNTER — Ambulatory Visit: Payer: Medicare Other | Admitting: Occupational Therapy

## 2015-11-24 ENCOUNTER — Encounter: Payer: Self-pay | Admitting: Occupational Therapy

## 2015-11-24 DIAGNOSIS — R41842 Visuospatial deficit: Secondary | ICD-10-CM

## 2015-11-24 DIAGNOSIS — R278 Other lack of coordination: Secondary | ICD-10-CM | POA: Diagnosis not present

## 2015-11-24 DIAGNOSIS — R2689 Other abnormalities of gait and mobility: Secondary | ICD-10-CM

## 2015-11-24 DIAGNOSIS — R2681 Unsteadiness on feet: Secondary | ICD-10-CM | POA: Diagnosis not present

## 2015-11-24 DIAGNOSIS — M6281 Muscle weakness (generalized): Secondary | ICD-10-CM

## 2015-11-24 NOTE — Therapy (Signed)
[facility] [address] [place], [place], [address] Phone: [phone]   Fax:  [phone]  Occupational Therapy Treatment  Patient Details  Name: Christian Jennings MRN: [id number] Date of Birth: Oct 05, 1950 Referring Provider: Dr. Erlinda Hong  Encounter Date: 11/24/2015      OT End of Session - 11/24/15 1620    Visit Number 2   Number of Visits 17   Date for OT Re-Evaluation 01/17/16   Authorization Type Medicare    Authorization - Visit Number 2   Authorization - Number of Visits 10   OT Start Time [id number]   OT Stop Time 1612   OT Time Calculation (min) 42 min   Activity Tolerance Patient tolerated treatment well      Past Medical History:  Diagnosis Date  . CVA (cerebral vascular accident) ([place])   . HLD (hyperlipidemia)   . HTN (hypertension)   . PAF (paroxysmal atrial fibrillation) ([place])     Past Surgical History:  Procedure Laterality Date  . EYE SURGERY     As child for muscle issue  . EYE SURGERY     Cosmetic for drooping eyelid    There were no vitals filed for this visit.      Subjective Assessment - 11/24/15 1535    Subjective  I went to [employer] today - I was pretty tired after but I did it.    Pertinent History see Epic   Patient Stated Goals 100% recovery   Currently in Pain? Yes   Pain Score 2    Pain Location Shoulder   Pain Orientation Left   Pain Descriptors / Indicators Aching   Pain Type Acute pain   Pain Onset 1 to 4 weeks ago   Pain Frequency Intermittent   Aggravating Factors  raising arm overhead   Pain Relieving Factors avoid raising arm overhead                      OT Treatments/Exercises (OP) - 11/24/15 0001      Neurological Re-education Exercises   Other Exercises 1 Pt reporting L shoulder pain with overhead reach therefore transitioned to supine for UE HEP.  Pt instructed in closed chain HEP with emphasis on bilateral overhead reach, chest presses with place and  hold and abduction with ER . Pt required mod cues initially however improved with repetition. Pt able to complete entire program with no pain. Wife present and will cue prn.  Pt and wife able to return demonstrate program. WIll advance pt to sitting once pt has no pain in sitting.  Also reviewed goals and issued written copy at pt's request.                 OT Education - 11/24/15 1605    Education provided Yes   Education Details HEP for LUE proximal strength   Person(s) Educated Patient;Spouse   Methods Explanation;Demonstration;Verbal cues;Tactile cues;Handout   Comprehension Verbalized understanding;Returned demonstration          OT Short Term Goals - 11/24/15 1618      OT SHORT TERM GOAL #1   Title I with inital HEP- due 11//03/17   Time 4   Status On-going     OT SHORT TERM GOAL #2   Title Pt will improve LUE functional use as evidenced by increasing LUE box and blocks score to 24 blocks.   Baseline RUE 63 blocks, LUE 19 blocks   Time 4   Period Weeks  Status On-going     OT SHORT TERM GOAL #3   Title Pt will perform all UB bathing and dressing with supervision/ set up.   Time 4   Period Weeks   Status On-going     OT SHORT TERM GOAL #4   Title Pt will perfrom lower body bathing and dressing with no more than min A.   Time 4   Period Weeks   Status On-going     OT SHORT TERM GOAL #5   Title Pt will demonstrate ability to safely navigate a busy environment and locate items with 95% or better acccuracy.   Time 4   Period Weeks   Status On-going           OT Long Term Goals - 11/24/15 1618      OT LONG TERM GOAL #1   Title Pt will demonstrate ability to perform LUE  9 hole peg test in 2 mins or less. due 01/17/16   Time 8   Period Weeks   Status On-going     OT LONG TERM GOAL #2   Title Pt will perform all basic ADLS with supervision.   Time 8   Period Weeks   Status On-going     OT LONG TERM GOAL #3   Title pt will perform light home  management / cooking with supervision demonstrating good safety awareness.   Time 8   Period Weeks   Status On-going     OT LONG TERM GOAL #4   Title Pt will demonstrate ability to retrieve 3 lbs weight from ovehead shelf with LUE without dropping.   Time 8   Period Weeks   Status On-going     OT LONG TERM GOAL #5   Title Pt will demonstrate ability to carry a plate of food in left hand without spills or drops.   Time 8   Status On-going               Plan - 11/24/15 1618    Clinical Impression Statement Pt reports pain of 2/10 wtih overhead reach today - pain limits overhead reach to approximately 110*.  Pt issued HEP.    Rehab Potential Good   OT Frequency 2x / week   OT Duration 8 weeks   OT Treatment/Interventions Self-care/ADL training;Moist Heat;Fluidtherapy;DME and/or AE instruction;Splinting;Patient/family education;Balance training;Therapeutic exercises;Ultrasound;Therapeutic exercise;Therapeutic activities;Cognitive remediation/compensation;Passive range of motion;Functional Mobility Training;Neuromuscular education;Electrical Stimulation;Parrafin;Energy conservation;Manual Therapy;Visual/perceptual remediation/compensation   Plan check HEP.  Observe pt's ability to complete coordinaton execises without abnormal proximal movement.    Consulted and Agree with Plan of Care Family member/caregiver;Patient   Family Member Consulted wife      Patient will benefit from skilled therapeutic intervention in order to improve the following deficits and impairments:  Abnormal gait, Decreased coordination, Decreased range of motion, Difficulty walking, Decreased endurance, Decreased activity tolerance, Impaired UE functional use, Decreased knowledge of use of DME, Decreased balance, Decreased cognition, Decreased mobility, Decreased strength, Impaired perceived functional ability, Impaired vision/preception  Visit Diagnosis: Muscle weakness (generalized)  Other abnormalities of  gait and mobility  Unsteadiness on feet  Other lack of coordination  Visuospatial deficit    Problem List Patient Active Problem List   Diagnosis Date Noted  . PAF (paroxysmal atrial fibrillation) ([place])   . HTN (hypertension)   . CVA (cerebral vascular accident) ([place])   . Hyperlipidemia   . Acute ischemic stroke ([place]) 11/14/2015  . Atrial fibrillation with rapid ventricular response ([place]) 11/14/2015  . Essential hypertension 11/14/2015  Quay Burow, OTR/L 11/24/2015, 4:23 PM  [facility] [address] [place], [place], [address] Phone: [phone]   Fax:  [phone]  Name: Christian Jennings MRN: [id number] Date of Birth: [birth year]

## 2015-11-24 NOTE — Patient Instructions (Signed)
Home Exercise program for your arms:  Do these every day 2 times per day. Only work within your limits of pain.  If you get pain stop and let your therapist know!  1.  Lay on your back and hold an UN-WEIGHTED ball between your hands at chest level.  Raise the ball toward the ceiling until your elbows are straight.  KEEPING YOUR ELBOWS STRAIGHT, slowly raise the ball over your head as far as you can go then bring ball toward your knees. Do 15 , rest then do 15 more.  2.  Lay on your back and hold the un-weighted ball between your hands at chest level.  Slowly raise the ball until your elbows are straight and then HOLD FOR A SLOW count of 5.  Return to starting position.  Do 15, rest then do 15 more.    3.  Lay on your back and hold the un-weighted ball. Start at chest level and raise the ball toward the ceiling until your elbows are straight.  KEEPING YOUR ELBOWS STRAIGHT, bring the ball to the right first and then to the left. Make sure you move slowly and with control.  Do 15, rest then do 15 more.    We will add weight soon but first we want pain free range of motion in preparation for overhead reach!  Move slowly, keep your palms flat on the ball and keep your elbows straight!!

## 2015-11-25 ENCOUNTER — Ambulatory Visit (INDEPENDENT_AMBULATORY_CARE_PROVIDER_SITE_OTHER): Payer: Medicare Other | Admitting: Cardiology

## 2015-11-25 ENCOUNTER — Encounter: Payer: Self-pay | Admitting: Cardiology

## 2015-11-25 VITALS — BP 201/96 | HR 63 | Ht 64.5 in | Wt 147.2 lb

## 2015-11-25 DIAGNOSIS — I10 Essential (primary) hypertension: Secondary | ICD-10-CM

## 2015-11-25 DIAGNOSIS — I639 Cerebral infarction, unspecified: Secondary | ICD-10-CM

## 2015-11-25 DIAGNOSIS — R946 Abnormal results of thyroid function studies: Secondary | ICD-10-CM | POA: Diagnosis not present

## 2015-11-25 DIAGNOSIS — E785 Hyperlipidemia, unspecified: Secondary | ICD-10-CM

## 2015-11-25 DIAGNOSIS — Z8673 Personal history of transient ischemic attack (TIA), and cerebral infarction without residual deficits: Secondary | ICD-10-CM

## 2015-11-25 DIAGNOSIS — R7989 Other specified abnormal findings of blood chemistry: Secondary | ICD-10-CM | POA: Insufficient documentation

## 2015-11-25 DIAGNOSIS — I480 Paroxysmal atrial fibrillation: Secondary | ICD-10-CM | POA: Diagnosis not present

## 2015-11-25 DIAGNOSIS — I739 Peripheral vascular disease, unspecified: Secondary | ICD-10-CM

## 2015-11-25 DIAGNOSIS — I779 Disorder of arteries and arterioles, unspecified: Secondary | ICD-10-CM

## 2015-11-25 MED ORDER — LISINOPRIL 10 MG PO TABS: 10.0000 mg | ORAL_TABLET | Freq: Every day | ORAL | 3 refills | Status: DC

## 2015-11-25 NOTE — Progress Notes (Signed)
11/25/2015 Christian Jennings   05-25-1950  [id number]  Primary Physician No PCP Per Patient Primary Cardiologist: Dr Percival Spanish  HPI:  65 y.o. male with no previous medical history (likley by virtue of not seeing a medical provider) who presented to [facility] ED on 11/14/15 with left sided weakness. He was found to have afib with RVR as well as an acute CVA. Cardiology consulted for help managing atrial fibrillation. The patient hadn'tt seen a physician in over 20 years and was not taking any medications. He converted to NSR after he was given Metoprolol. His CHADs VASc is 2 and he was placed on Eliquis. Echo showed preserved LVF-65-70%. He is in the office today for follow up. He has felt well, he still has some Lt sided coordination issues but he is improving with PT. He denies chest pain, dyspnea, or palpitations. He is in NSR in the office today.    Current Outpatient Prescriptions  Medication Sig Dispense Refill  . apixaban (ELIQUIS) 5 MG TABS tablet Take 1 tablet (5 mg total) by mouth 2 (two) times daily. 60 tablet 0  . atorvastatin (LIPITOR) 80 MG tablet Take 1 tablet (80 mg total) by mouth daily at 6 PM. 30 tablet 0  . metoprolol tartrate (LOPRESSOR) 25 MG tablet Take 1 tablet (25 mg total) by mouth 2 (two) times daily. 60 tablet 0  . lisinopril (PRINIVIL,ZESTRIL) 10 MG tablet Take 1 tablet (10 mg total) by mouth daily. 90 tablet 3   No current facility-administered medications for this visit.     No Known Allergies  Social History   Social History  . Marital status: Married    Spouse name: N/A  . Number of children: N/A  . Years of education: N/A   Occupational History  . Not on file.   Social History Main Topics  . Smoking status: Never Smoker  . Smokeless tobacco: Never Used  . Alcohol use No  . Drug use: Unknown  . Sexual activity: Yes    Birth control/ protection: None   Other Topics Concern  . Not on file   Social History Narrative  . No narrative on file      Review of Systems: General: negative for chills, fever, night sweats or weight changes.  Cardiovascular: negative for chest pain, dyspnea on exertion, edema, orthopnea, palpitations, paroxysmal nocturnal dyspnea or shortness of breath Dermatological: negative for rash Respiratory: negative for cough or wheezing Urologic: negative for hematuria Abdominal: negative for nausea, vomiting, diarrhea, bright red blood per rectum, melena, or hematemesis Neurologic: negative for visual changes, syncope, or dizziness All other systems reviewed and are otherwise negative except as noted above.    Blood pressure (!) 201/96, pulse 63, height 5' 4.5" (1.638 m), weight 147 lb 3.2 oz (66.8 kg).  General appearance: alert, cooperative and no distress Neck: no carotid bruit and no JVD Lungs: clear to auscultation bilaterally Heart: regular rate and rhythm Extremities: extremities normal, atraumatic, no cyanosis or edema Skin: Skin color, texture, turgor normal. No rashes or lesions Neurologic: Grossly normal  EKG NSR  ASSESSMENT AND PLAN:   History of CVA (cerebrovascular accident) Rt brain CVA 11/14/15  Accelerated hypertension B/P by me 192/92  PAF (paroxysmal atrial fibrillation) (HCC) Holding NSR  Hyperlipidemia LDL was 216- he is on high dose statin Rx  Carotid arterial disease (HCC) 123456 LICA  Elevated TSH Repeat today   PLAN  Repeat B/P by me 192/92. I suggested we start him on Lisinopril 10 mg. He is a  little medication shy and I would like to go slow but I feel he may need further adjustment. I suggested he come back Monday for a B/P check, I think the fewer medications he is prescribed the more likely he will be compliant. I spent at least 30 minutes with the pt and his wife. explaining our rationale for each of his current medications. have discussed his case with Rosaria Ferries who will see him Monday as I will not be in the office that day. I also ordered a re-check of his  TSH and free T4. today.  He'll need follow up CMET and Lipids in 3 months with an OV with Dr Percival Spanish after that.   [name] PA-C 11/25/2015 4:51 PM

## 2015-11-25 NOTE — Assessment & Plan Note (Signed)
B/P by me 192/92

## 2015-11-25 NOTE — Assessment & Plan Note (Signed)
Repeat today

## 2015-11-25 NOTE — Patient Instructions (Addendum)
Medication Instructions:  Your physician has recommended you make the following change in your medication:  1) START Lisinopril 10 mg tablet by mouth ONCE daily  Labwork: Your physician recommends that you return for lab work in: TODAY (TSH/FreeT4) The lab can be found on the [address] of out building in [place] recommends that you return for lab work in: 83months-FASTING (CMET/Lipid) The lab can be found on the FIRST FLOOR of out building in [address]   Testing/Procedures: none  Follow-Up: Your physician recommends that you schedule a follow-up appointment in: 1 week Rosaria Ferries, [place] -12/01/2015 at [place] recommends that you schedule a follow-up appointment in: 3 months with Dr. Percival Spanish - 03/01/2016 at [facility]    Any Other Special Instructions Will Be Listed Below (If Applicable).     If you need a refill on your cardiac medications before your next appointment, please call your pharmacy.

## 2015-11-25 NOTE — Assessment & Plan Note (Signed)
LDL was 216- he is on high dose statin Rx

## 2015-11-25 NOTE — Assessment & Plan Note (Addendum)
Rt brain CVA 11/14/15

## 2015-11-25 NOTE — Assessment & Plan Note (Signed)
Controlled.  

## 2015-11-25 NOTE — Assessment & Plan Note (Signed)
RICA stent Dr Al Corpus in [place] July 2017

## 2015-11-25 NOTE — Assessment & Plan Note (Signed)
Holding NSR 

## 2015-11-25 NOTE — Assessment & Plan Note (Signed)
123456 LICA

## 2015-11-26 ENCOUNTER — Ambulatory Visit: Payer: Medicare Other | Admitting: Occupational Therapy

## 2015-11-26 ENCOUNTER — Ambulatory Visit: Payer: Medicare Other | Admitting: Physical Therapy

## 2015-11-26 DIAGNOSIS — R2681 Unsteadiness on feet: Secondary | ICD-10-CM

## 2015-11-26 DIAGNOSIS — R2689 Other abnormalities of gait and mobility: Secondary | ICD-10-CM

## 2015-11-26 DIAGNOSIS — R278 Other lack of coordination: Secondary | ICD-10-CM

## 2015-11-26 DIAGNOSIS — R41842 Visuospatial deficit: Secondary | ICD-10-CM | POA: Diagnosis not present

## 2015-11-26 DIAGNOSIS — M6281 Muscle weakness (generalized): Secondary | ICD-10-CM | POA: Diagnosis not present

## 2015-11-26 LAB — T4, FREE: Free T4: 1.3 ng/dL (ref 0.8–1.8)

## 2015-11-26 LAB — TSH: TSH: 3.55 mIU/L (ref 0.40–4.50)

## 2015-11-26 NOTE — Patient Instructions (Signed)
  Coordination Activities  Perform the following activities for 20 minutes 1 times per day with left hand(s).   Rotate ball in fingertips (clockwise and counter-clockwise).  Toss ball between hands.  Flip cards 1 at a time as fast as you can.  Deal cards with your thumb (Hold deck in hand and push card off top with thumb).  Pick up coins, buttons, marbles, dried beans/pasta of different sizes and place in container.  Pick up coins and place in container or coin bank.  Pick up coins and stack.  Pick up coins one at a time until you get 5-10 in your hand, then move coins from palm to fingertips to stack one at a time.  Screw together nuts and bolts, then unfasten.       Laying down, squeeze shoulder blades together and shoulders back towards bed, 15 reps 2x day

## 2015-11-26 NOTE — Patient Instructions (Addendum)
Heel Raise: Unilateral (Standing)    Balance on left foot, then rise on ball of foot. Repeat _10___ times per set. Do __3_ sets per session. Do _2___ sessions per day.  http://orth.exer.us/40   Copyright  VHI. All rights reserved.  Hip Extension (Prone)    Lift left leg __10__ inches from floor, keeping knee locked. Repeat __10_ times per set. Do __2__ sets per session. Do __2__ sessions per day.  http://orth.exer.us/98   Copyright  VHI. All rights reserved.  HIP: Extension, Bridging Unilateral    Bend knee and place foot on surface. Push down through bent leg, tighten glutes. Raise hips up. _10__ reps per set, _2__ sets per day, __5_ days per week   Copyright  VHI. All rights reserved.  Knee Flexion With One Pillow: Sagittal Plane Stability    Bend knee. Keep pelvis still ("tuck tail"). Do _10_ times. Restabilize pelvis. Repeat with other leg. Do _2_ sets, _2__ times per day.  http://ss.exer.us/54   Copyright  VHI. All rights reserved.  Hamstring Stretch, Seated (Strap, Two Chairs)    Sit with one leg extended onto facing chair. Loop strap over outstretched foot at ball of big toe. Lengthen spine. Hold for __60__ breaths. Repeat _1___ times left leg.  (NO STRAP)  Copyright  VHI. All rights reserved.  Gastroc / Heel Cord Stretch - On Step    Stand with heels over edge of stair. Holding rail, lower heels until stretch is felt in calf of legs. Repeat _1__ times. Do 2-3___ times per day.  HOLD 60 secs  Copyright  VHI. All rights reserved.  Heel Cord Stretch    Place one leg forward, bent, other leg behind and straight. Lean forward keeping back heel flat. Hold _60___ seconds while counting out loud. Repeat with other leg. Repeat __1__ times. Do _2___ sessions per day. Left leg in back http://gt2.exer.us/511   Copyright  VHI. All rights reserved.

## 2015-11-26 NOTE — Therapy (Signed)
[facility] [address] [place], [place], [address] Phone: [phone]   Fax:  [phone]  Occupational Therapy Treatment  Patient Details  Name: Christian Jennings MRN: [id number] Date of Birth: [birth year] Referring Provider: Dr. Erlinda Hong  Encounter Date: 11/26/2015      OT End of Session - 11/26/15 1726    Visit Number 3   Number of Visits 17   Date for OT Re-Evaluation 01/17/16   Authorization Type Medicare    Authorization - Visit Number 3   Authorization - Number of Visits 10   OT Start Time [id number]   OT Stop Time 1615   OT Time Calculation (min) 40 min   Activity Tolerance Patient tolerated treatment well   Behavior During Therapy [facility] for tasks assessed/performed      Past Medical History:  Diagnosis Date  . CVA (cerebral vascular accident) ([place])   . HLD (hyperlipidemia)   . HTN (hypertension)   . PAF (paroxysmal atrial fibrillation) ([place])     Past Surgical History:  Procedure Laterality Date  . EYE SURGERY     As child for muscle issue  . EYE SURGERY     Cosmetic for drooping eyelid    There were no vitals filed for this visit.      Subjective Assessment - 11/26/15 1544    Pertinent History see Epic   Patient Stated Goals 100% recovery   Currently in Pain? No/denies            Therapist reviewed previous shoulder exercises in supine, 15 reps each, min v.c. For proper positioning. New exercise for shoulder /scapular retraction issued, see pt instructions.                  OT Education - 11/26/15 1726    Education provided Yes   Education Details review of previous HEP(issued last visit, 15 reps each), coordination HEP-see pt instructions   Person(s) Educated Patient;Spouse   Methods Explanation;Demonstration;Verbal cues;Handout   Comprehension Verbalized understanding;Returned demonstration          OT Short Term Goals - 11/24/15 1618      OT SHORT TERM GOAL #1   Title  I with inital HEP- due 11//03/17   Time 4   Status On-going     OT SHORT TERM GOAL #2   Title Pt will improve LUE functional use as evidenced by increasing LUE box and blocks score to 24 blocks.   Baseline RUE 63 blocks, LUE 19 blocks   Time 4   Period Weeks   Status On-going     OT SHORT TERM GOAL #3   Title Pt will perform all UB bathing and dressing with supervision/ set up.   Time 4   Period Weeks   Status On-going     OT SHORT TERM GOAL #4   Title Pt will perfrom lower body bathing and dressing with no more than min A.   Time 4   Period Weeks   Status On-going     OT SHORT TERM GOAL #5   Title Pt will demonstrate ability to safely navigate a busy environment and locate items with 95% or better acccuracy.   Time 4   Period Weeks   Status On-going           OT Long Term Goals - 11/24/15 1618      OT LONG TERM GOAL #1   Title Pt will demonstrate ability to perform LUE  9 hole peg test in  2 mins or less. due 01/17/16   Time 8   Period Weeks   Status On-going     OT LONG TERM GOAL #2   Title Pt will perform all basic ADLS with supervision.   Time 8   Period Weeks   Status On-going     OT LONG TERM GOAL #3   Title pt will perform light home management / cooking with supervision demonstrating good safety awareness.   Time 8   Period Weeks   Status On-going     OT LONG TERM GOAL #4   Title Pt will demonstrate ability to retrieve 3 lbs weight from ovehead shelf with LUE without dropping.   Time 8   Period Weeks   Status On-going     OT LONG TERM GOAL #5   Title Pt will demonstrate ability to carry a plate of food in left hand without spills or drops.   Time 8   Status On-going               Plan - 11/26/15 1721    Clinical Impression Statement Pt is progressing towards goals. He demonstrates understanding of HEP.   Rehab Potential Good   OT Frequency 2x / week   OT Duration 8 weeks   OT Treatment/Interventions Self-care/ADL training;Moist  Heat;Fluidtherapy;DME and/or AE instruction;Splinting;Patient/family education;Balance training;Therapeutic exercises;Ultrasound;Therapeutic exercise;Therapeutic activities;Cognitive remediation/compensation;Passive range of motion;Functional Mobility Training;Neuromuscular education;Electrical Stimulation;Parrafin;Energy conservation;Manual Therapy;Visual/perceptual remediation/compensation   Plan continue to progress HEP, LUE functional use   Consulted and Agree with Plan of Care Family member/caregiver;Patient      Patient will benefit from skilled therapeutic intervention in order to improve the following deficits and impairments:  Abnormal gait, Decreased coordination, Decreased range of motion, Difficulty walking, Decreased endurance, Decreased activity tolerance, Impaired UE functional use, Decreased knowledge of use of DME, Decreased balance, Decreased cognition, Decreased mobility, Decreased strength, Impaired perceived functional ability, Impaired vision/preception  Visit Diagnosis: Muscle weakness (generalized)  Other abnormalities of gait and mobility  Unsteadiness on feet  Other lack of coordination    Problem List Patient Active Problem List   Diagnosis Date Noted  . Carotid arterial disease ([place]) 11/25/2015  . Elevated TSH 11/25/2015  . PAF (paroxysmal atrial fibrillation) ([place])   . Accelerated hypertension   . History of CVA (cerebrovascular accident)   . Hyperlipidemia   . Atrial fibrillation with rapid ventricular response ([place]) 11/14/2015    RINE,KATHRYN 11/26/2015, 5:27 PM  [facility] [address] [place], [place], [address] Phone: [phone]   Fax:  [phone]  Name: Christian Jennings MRN: [id number] Date of Birth: 06/07/1950

## 2015-11-26 NOTE — Therapy (Signed)
[facility] [address] [place], [place], [address] Phone: [phone]   Fax:  [phone]  Physical Therapy Treatment  Patient Details  Name: Christian Jennings MRN: [id number] Date of Birth: Jul 02, 1950 Referring Provider: Rosalin Hawking  Encounter Date: 11/26/2015      PT End of Session - 11/26/15 1653    Visit Number 2   Number of Visits 17   Date for PT Re-Evaluation 01/20/16   Authorization Type Medicare primary/BCBS secondary   PT Start Time 1448   PT Stop Time 1531   PT Time Calculation (min) 43 min      Past Medical History:  Diagnosis Date  . CVA (cerebral vascular accident) ([place])   . HLD (hyperlipidemia)   . HTN (hypertension)   . PAF (paroxysmal atrial fibrillation) ([place])     Past Surgical History:  Procedure Laterality Date  . EYE SURGERY     As child for muscle issue  . EYE SURGERY     Cosmetic for drooping eyelid    There were no vitals filed for this visit.      Subjective Assessment - 11/26/15 1638    Subjective Pt reports no problems since last visit - states he wants to get back to 100% if possible; pt amb. without device - does not want to use anything for assistance with ambulation   Patient is accompained by: Family member   Patient Stated Goals Pt's goal is to get back to ultimate goal of 100% recovery.   Currently in Pain? No/denies                         [facility] Adult PT Treatment/Exercise - 11/26/15 1503      Ambulation/Gait   Ambulation/Gait Yes   Ambulation/Gait Assistance 5: Supervision   Ambulation/Gait Assistance Details decreased push off LLE due to weak plantarflexors   Ambulation Distance (Feet) 100 Feet   Assistive device None   Gait Pattern Decreased trunk rotation;Decreased hip/knee flexion - left;Decreased step length - left;Step-through pattern  decr. push off LLE   Ambulation Surface Level;Indoor     Exercises   Exercises Lumbar;Knee/Hip;Ankle      Lumbar Exercises: Stretches   Active Hamstring Stretch 1 rep;20 seconds     Lumbar Exercises: Aerobic   Stationary Bike Scifit level 2.5 x 7" with UE's and LE'     Lumbar Exercises: Standing   Heel Raises 10 reps;Other (comment)  LLE only     Lumbar Exercises: Supine   Bridge 10 reps     Lumbar Exercises: Prone   Straight Leg Raise 10 reps  pillow under hips   Other Prone Lumbar Exercises hip extension with L knee flexed at 90 degrees for knee control     Lumbar Exercises: Quadruped   Other Quadruped Lumbar Exercises L hip extension - knee flexion/extension with LLE held in extension x 10 reps; L knee flexed at 90 degrees - small hip extension pulses x 2 set 10 reps with assistance to maintain position of L knee held flexed at 90 degrees                             Knee/Hip Exercises: Seated   Hamstring Curl Strengthening;Left;2 sets  3# used for 1st set 10 reps:  5# used for 2nd set     Knee/Hip Exercises: Supine   Single Leg Bridge Left;1 set;10 reps  Bridging - RLE extension for LLE stabilization and hip strengthening x 10 reps  L heel cord stretch in standing - 30 sec hold - using bottom shelf in cabinet Seated L hamstring stretch - 30 sec hold Runner's stretch in standing - for L hamstring and heel cord - 30 sec hold   L hip extension in standing - 3# weight 10 reps  Amb. On heels (to increase dorsiflexor strength) - knees extended 35' x 1 with SBA:  Then with knees flexed, amb. On heels for  Hamstring and dorsiflexor strengthening 35' x 1 rep          PT Education - 11/26/15 1648    Education provided Yes   Education Details HEP for LLE strengthening and stretching - see pt instructions;    Person(s) Educated Patient;Spouse   Methods Explanation;Demonstration;Handout   Comprehension Verbalized understanding;Returned demonstration          PT Short Term Goals - 11/22/15 1509      PT SHORT TERM GOAL #1   Title Pt will be independent with HEP  for strengthening, balance, and gait.  TARGET 12/22/15   Time 4   Period Weeks   Status New     PT SHORT TERM GOAL #2   Title Pt will improve Berg Balance score to at least 45/56 for decreased fall risk.   Time 4   Period Weeks   Status New     PT SHORT TERM GOAL #3   Title Pt will improve TUG score to less than or equal to 13.5 seconds for decreased fall risk.   Time 4   Period Weeks   Status New     PT SHORT TERM GOAL #4   Title Pt will negotiate at least 4 steps, -alternating step pattern with 1 handrail, modified independently.   Time 4   Period Weeks   Status New     PT SHORT TERM GOAL #5   Title Pt/family will verbalize understanding of CVA warning signs/symptoms.   Time 4   Period Weeks   Status New           PT Long Term Goals - 11/22/15 1511      PT LONG TERM GOAL #1   Title Pt/family will verbalize understanding of fall prevention within home environment.  TARGET 01/20/16   Time 8   Period Weeks   Status New     PT LONG TERM GOAL #2   Title Pt will improve Dynamic Gait Index score to at least 19/24 for decreased fall risk.   Time 8   Period Weeks   Status New     PT LONG TERM GOAL #3   Title Pt will improve gait velocity to at least 3.3 ft/sec for improved gait efficiency and safety.   Time 8   Period Weeks   Status New     PT LONG TERM GOAL #4   Title Pt will negotiate at least 4 steps, alternating pattern, no handrail, independently, for improved safety and efficiency with stairs.   Time 8   Period Weeks   Status New     PT LONG TERM GOAL #5   Title Pt will ambulate at least 1000 ft indoor and outdoor surfaces, modified independently, for improved outdoor gait/return to leisure activities.   Time 8   Period Weeks   Status New     Additional Long Term Goals   Additional Long Term Goals Yes     PT LONG TERM GOAL #6  Title Pt will improve Neuro QOL score on FOTO by at least 10% to demonstrate improved functional mobility and recovery.    Time 8   Period Weeks   Status New               Plan - 11/26/15 1653    Clinical Impression Statement Pt presents with weakness in L hip extensors, L hamstrings and L plantarflexors which result in gait deviations.  Gait pattern improved with cues to increase L initial heel contact and icnrease push off as much as able (due to weakness).  Pt needs cues for correct form with exercises to minimize compensatory techniques and substitutions.   Rehab Potential Good   PT Frequency 2x / week   PT Duration 8 weeks   PT Treatment/Interventions ADLs/Self Care Home Management;Functional mobility training;Stair training;Gait training;DME Instruction;Therapeutic activities;Therapeutic exercise;Balance training;Neuromuscular re-education;Patient/family education;Orthotic Fit/Training;Electrical Stimulation   PT Next Visit Plan Check HEP;  continue LLE strengthening; high level balance and gait with cues to minimize deviations; pt declines use of assistive device   PT Home Exercise Plan LLE strengthening and stretching   Consulted and Agree with Plan of Care Patient;Family member/caregiver   Family Member Consulted wife      Patient will benefit from skilled therapeutic intervention in order to improve the following deficits and impairments:  Abnormal gait, Decreased balance, Decreased mobility, Decreased coordination, Decreased safety awareness, Decreased strength, Difficulty walking, Postural dysfunction, Impaired tone  Visit Diagnosis: Muscle weakness (generalized)  Other abnormalities of gait and mobility     Problem List Patient Active Problem List   Diagnosis Date Noted  . Carotid arterial disease ([place]) 11/25/2015  . Elevated TSH 11/25/2015  . PAF (paroxysmal atrial fibrillation) ([place])   . Accelerated hypertension   . History of CVA (cerebrovascular accident)   . Hyperlipidemia   . Atrial fibrillation with rapid ventricular response ([place]) 11/14/2015    Chyrel Taha, [place],  PT 11/26/2015, 5:05 PM  [facility] [address] [place], [place], [address] Phone: [phone]   Fax:  [phone]  Name: SHAUL LARMON MRN: [id number] Date of Birth: 02/04/1951

## 2015-11-28 ENCOUNTER — Ambulatory Visit: Payer: Medicare Other | Admitting: Physical Therapy

## 2015-11-28 DIAGNOSIS — M6281 Muscle weakness (generalized): Secondary | ICD-10-CM

## 2015-11-28 DIAGNOSIS — R278 Other lack of coordination: Secondary | ICD-10-CM

## 2015-11-28 DIAGNOSIS — R2681 Unsteadiness on feet: Secondary | ICD-10-CM | POA: Diagnosis not present

## 2015-11-28 DIAGNOSIS — R2689 Other abnormalities of gait and mobility: Secondary | ICD-10-CM

## 2015-11-28 DIAGNOSIS — R41842 Visuospatial deficit: Secondary | ICD-10-CM | POA: Diagnosis not present

## 2015-11-28 NOTE — Therapy (Signed)
[facility] [address] [place], [place], [address] Phone: [phone]   Fax:  [phone]  Physical Therapy Treatment  Patient Details  Name: Christian Jennings MRN: [id number] Date of Birth: 1950-05-24 Referring Provider: Rosalin Hawking  Encounter Date: 11/28/2015      PT End of Session - 11/28/15 1233    Visit Number 3   Number of Visits 17   PT Start Time 1100   PT Stop Time 1145   PT Time Calculation (min) 45 min      Past Medical History:  Diagnosis Date  . CVA (cerebral vascular accident) ([place])   . HLD (hyperlipidemia)   . HTN (hypertension)   . PAF (paroxysmal atrial fibrillation) ([place])     Past Surgical History:  Procedure Laterality Date  . EYE SURGERY     As child for muscle issue  . EYE SURGERY     Cosmetic for drooping eyelid    There were no vitals filed for this visit.      Subjective Assessment - 11/28/15 1107    Subjective pt feels he's already walking better; he has been doing the ex's ; no falls; he was able to explain cva and understands his HTN and afib and how it contributed to his cva;    Currently in Pain? No/denies          Neuromuscular re-ed  Utilized  Four square step test ; pt initially completed in 13 seconds;  Had him slow down and step as high and slowly as possible; multi bouts; then added ball in his hands and had him reaching outside base of support ( put ball in next square first) then stepping to the ball/next square   There ex  Instructed on standing calf stretch and hamstring stretch on step  5 x 30 sec bouts                       PT Education - 11/28/15 1209    Education provided Yes   Education Details instructed pt to focus on quality vs quantity of movt ; focus on slow graded mvt; graceful mvt. with all of the therex he does; educated on need for brain to get max feed back with ther ex with goal of improved muscle control   Person(s)  Educated Patient;Spouse   Comprehension Verbalized understanding          PT Short Term Goals - 11/22/15 1509      PT SHORT TERM GOAL #1   Title Pt will be independent with HEP for strengthening, balance, and gait.  TARGET 12/22/15   Time 4   Period Weeks   Status New     PT SHORT TERM GOAL #2   Title Pt will improve Berg Balance score to at least 45/56 for decreased fall risk.   Time 4   Period Weeks   Status New     PT SHORT TERM GOAL #3   Title Pt will improve TUG score to less than or equal to 13.5 seconds for decreased fall risk.   Time 4   Period Weeks   Status New     PT SHORT TERM GOAL #4   Title Pt will negotiate at least 4 steps, -alternating step pattern with 1 handrail, modified independently.   Time 4   Period Weeks   Status New     PT SHORT TERM GOAL #5   Title Pt/family will verbalize understanding of CVA  warning signs/symptoms.   Time 4   Period Weeks   Status New           PT Long Term Goals - 11/22/15 1511      PT LONG TERM GOAL #1   Title Pt/family will verbalize understanding of fall prevention within home environment.  TARGET 01/20/16   Time 8   Period Weeks   Status New     PT LONG TERM GOAL #2   Title Pt will improve Dynamic Gait Index score to at least 19/24 for decreased fall risk.   Time 8   Period Weeks   Status New     PT LONG TERM GOAL #3   Title Pt will improve gait velocity to at least 3.3 ft/sec for improved gait efficiency and safety.   Time 8   Period Weeks   Status New     PT LONG TERM GOAL #4   Title Pt will negotiate at least 4 steps, alternating pattern, no handrail, independently, for improved safety and efficiency with stairs.   Time 8   Period Weeks   Status New     PT LONG TERM GOAL #5   Title Pt will ambulate at least 1000 ft indoor and outdoor surfaces, modified independently, for improved outdoor gait/return to leisure activities.   Time 8   Period Weeks   Status New     Additional Long Term Goals    Additional Long Term Goals Yes     PT LONG TERM GOAL #6   Title Pt will improve Neuro QOL score on FOTO by at least 10% to demonstrate improved functional mobility and recovery.   Time 8   Period Weeks   Status New               Plan - 11/28/15 1252    Clinical Impression Statement Pt has good ability and control with sit to stand and standing balance;  he has 3 loss of balance with single leg stance and stepping out side base of support activty; slower graded mvt was very difficult for him to control; pt is very motivated and may push himself too hard initally; educated him and wife on need for adequate rest to avoid excessive fatigue   Rehab Potential Good   PT Frequency 2x / week   PT Duration 8 weeks   PT Treatment/Interventions ADLs/Self Care Home Management;Functional mobility training;Stair training;Gait training;DME Instruction;Therapeutic activities;Therapeutic exercise;Balance training;Neuromuscular re-education;Patient/family education;Orthotic Fit/Training;Electrical Stimulation   PT Next Visit Plan Check HEP;  continue LLE strengthening; high level balance and gait with cues to minimize deviations;     PT Home Exercise Plan LLE strengthening and stretching; dynamic balance exs   Consulted and Agree with Plan of Care Patient;Family member/caregiver   Family Member Consulted wife      Patient will benefit from skilled therapeutic intervention in order to improve the following deficits and impairments:  Abnormal gait, Decreased balance, Decreased mobility, Decreased coordination, Decreased safety awareness, Decreased strength, Difficulty walking, Postural dysfunction, Impaired tone  Visit Diagnosis: Muscle weakness (generalized)  Other abnormalities of gait and mobility  Unsteadiness on feet  Other lack of coordination     Problem List Patient Active Problem List   Diagnosis Date Noted  . Carotid arterial disease ([place]) 11/25/2015  . Elevated TSH 11/25/2015   . PAF (paroxysmal atrial fibrillation) ([place])   . Accelerated hypertension   . History of CVA (cerebrovascular accident)   . Hyperlipidemia   . Atrial fibrillation with rapid ventricular response (  [place]) 11/14/2015    Rosaura Carpenter D PT DPT  11/28/2015, 12:57 PM  [facility] [address] [place], [place], [address] Phone: [phone]   Fax:  [phone]  Name: ANTOINIO GOBBI MRN: [id number] Date of Birth: 1951/01/22

## 2015-12-01 ENCOUNTER — Encounter: Payer: Self-pay | Admitting: Physician Assistant

## 2015-12-01 ENCOUNTER — Ambulatory Visit: Payer: Medicare Other | Admitting: Rehabilitation

## 2015-12-01 ENCOUNTER — Ambulatory Visit (INDEPENDENT_AMBULATORY_CARE_PROVIDER_SITE_OTHER): Payer: Medicare Other | Admitting: Student

## 2015-12-01 VITALS — BP 170/110 | HR 61 | Ht 64.5 in | Wt 147.0 lb

## 2015-12-01 DIAGNOSIS — I10 Essential (primary) hypertension: Secondary | ICD-10-CM

## 2015-12-01 DIAGNOSIS — I480 Paroxysmal atrial fibrillation: Secondary | ICD-10-CM | POA: Diagnosis not present

## 2015-12-01 DIAGNOSIS — Z8673 Personal history of transient ischemic attack (TIA), and cerebral infarction without residual deficits: Secondary | ICD-10-CM

## 2015-12-01 DIAGNOSIS — E785 Hyperlipidemia, unspecified: Secondary | ICD-10-CM | POA: Diagnosis not present

## 2015-12-01 DIAGNOSIS — I639 Cerebral infarction, unspecified: Secondary | ICD-10-CM

## 2015-12-01 MED ORDER — METOPROLOL TARTRATE 25 MG PO TABS: 25.0000 mg | ORAL_TABLET | Freq: Two times a day (BID) | ORAL | 6 refills | Status: DC

## 2015-12-01 MED ORDER — ATORVASTATIN CALCIUM 80 MG PO TABS: 80.0000 mg | ORAL_TABLET | Freq: Every day | ORAL | 6 refills | Status: DC

## 2015-12-01 MED ORDER — LISINOPRIL 10 MG PO TABS: 20.0000 mg | ORAL_TABLET | Freq: Every day | ORAL | 3 refills | Status: DC

## 2015-12-01 MED ORDER — APIXABAN 5 MG PO TABS: 5.0000 mg | ORAL_TABLET | Freq: Two times a day (BID) | ORAL | 6 refills | Status: DC

## 2015-12-01 NOTE — Patient Instructions (Signed)
Medication Instructions:   INCREASE LISINOPRIL TO 20 MG ONCE DAILY= 2 OF THE 10 MG TABLETS ONCE DAILY  REFILLS SENT TO THE PHARMACY ELECTRONICALLY  Follow-Up:  Your physician recommends that you schedule a follow-up appointment in: [place] OR [facility]

## 2015-12-01 NOTE — Progress Notes (Signed)
Cardiology Office Note    Date:  12/01/2015   ID:  ARMARI POINT, DOB 12-05-1950, MRN [id number]  PCP:  London Pepper, MD  Cardiologist: Dr. Percival Spanish  Chief Complaint  Patient presents with  . Follow-up    BP medication    History of Present Illness:    Christian Jennings is a 65 y.o. male with past medical history of HTN, HLD, paroxysmal atrial fibrillation (on Eliquis), and recent CVA (10/2015) who presents to the office today for follow-up of his blood pressure.  He was recently seen in the office by [name], PA-C on 11/25/2015. He reported doing well at that time, still recovering from his recent CVA. Initial blood pressure reading was 201/96. This was rechecked by the provider and found to be 192/92. He was therefore continued on Lopressor 25 mg twice a day and Lisinopril 10 mg daily was added to his medication regimen.  Today, he reports doing well. Is participating in physical therapy and occupational therapy multiple times per week. Left-sided weakness and coordination is improving. He has not checked his blood pressure regularly since his recent office visit. Reports good compliance with his medication regimen. Denies any lightheadedness, dizziness, or presyncope. Says he "feels warm" when he thinks his blood pressure is elevated. Denies any chest discomfort or palpitations.  Blood pressure today is 170/110. Reports feeling anxious about his appointment today as he is curious about what his blood pressure reading would be. Has been researching alternative methods to managing hypertension and is planning to try acupuncture. Says he understands the importance of being on his medications currently, but wants to do his part in helping to control his blood pressure.   Past Medical History:  Diagnosis Date  . CVA (cerebral vascular accident) ([place])    a. 10/2015 occurring in the setting of new-onset untreated atrial fibrillation. Started on Eliquis.  Marland Kitchen HLD (hyperlipidemia)   . HTN  (hypertension)   . PAF (paroxysmal atrial fibrillation) ([place])    a. on Eliquis following CVA in 10/2015.    Past Surgical History:  Procedure Laterality Date  . EYE SURGERY     As child for muscle issue  . EYE SURGERY     Cosmetic for drooping eyelid    Current Medications: Outpatient Medications Prior to Visit  Medication Sig Dispense Refill  . apixaban (ELIQUIS) 5 MG TABS tablet Take 1 tablet (5 mg total) by mouth 2 (two) times daily. 60 tablet 0  . atorvastatin (LIPITOR) 80 MG tablet Take 1 tablet (80 mg total) by mouth daily at 6 PM. 30 tablet 0  . lisinopril (PRINIVIL,ZESTRIL) 10 MG tablet Take 1 tablet (10 mg total) by mouth daily. 90 tablet 3  . metoprolol tartrate (LOPRESSOR) 25 MG tablet Take 1 tablet (25 mg total) by mouth 2 (two) times daily. 60 tablet 0   No facility-administered medications prior to visit.      Allergies:   Review of patient's allergies indicates no known allergies.   Social History   Social History  . Marital status: Married    Spouse name: N/A  . Number of children: N/A  . Years of education: N/A   Social History Main Topics  . Smoking status: Never Smoker  . Smokeless tobacco: Never Used  . Alcohol use No  . Drug use: Unknown  . Sexual activity: Yes    Birth control/ protection: None   Other Topics Concern  . None   Social History Narrative  . None  Family History:  The patient's family history includes Alcoholism in his father; COPD in his father; Heart attack in his maternal uncle; Throat cancer in his brother; Transient ischemic attack in his mother.   Review of Systems:   Please see the history of present illness.     General:  No chills, fever, night sweats or weight changes.  Cardiovascular:  No chest pain, dyspnea on exertion, edema, orthopnea, palpitations, paroxysmal nocturnal dyspnea. Dermatological: No rash, lesions/masses Respiratory: No cough, dyspnea Urologic: No hematuria, dysuria Abdominal:   No nausea,  vomiting, diarrhea, bright red blood per rectum, melena, or hematemesis Neurologic:  No visual changes, changes in mental status. Positive for left-sided weakness.  All other systems reviewed and are otherwise negative except as noted above.   Physical Exam:    VS:  BP (!) 170/110   Pulse 61   Ht 5' 4.5" (1.638 m)   Wt 147 lb (66.7 kg)   BMI 24.84 kg/m    General: Well developed, well nourished,male appearing in no acute distress. Head: Normocephalic, atraumatic, sclera non-icteric, no xanthomas, nares are without discharge.  Neck: No carotid bruits. JVD not elevated.  Lungs: Respirations regular and unlabored, without wheezes or rales.  Heart: Regular rate and rhythm. No S3 or S4.  No murmur, no rubs, or gallops appreciated. Abdomen: Soft, non-tender, non-distended with normoactive bowel sounds. No hepatomegaly. No rebound/guarding. No obvious abdominal masses. Msk:  Strength and tone appear normal for age. No joint deformities or effusions. Extremities: No clubbing or cyanosis. No edema.  Distal pedal pulses are 2+ bilaterally. Neuro: Alert and oriented X 3. Moves all extremities spontaneously. Decreased strength along left lower extremity. Psych:  Responds to questions appropriately with a normal affect. Skin: No rashes or lesions noted  Wt Readings from Last 3 Encounters:  12/01/15 147 lb (66.7 kg)  11/25/15 147 lb 3.2 oz (66.8 kg)  11/14/15 142 lb (64.4 kg)     Studies/Labs Reviewed:   EKG:  EKG is not ordered today.    Recent Labs: 11/17/2015: ALT 25; BUN 20; Creatinine, Ser 1.07; Hemoglobin 15.5; Magnesium 2.4; Platelets 169; Potassium 3.6; Sodium 142 11/25/2015: TSH 3.55   Lipid Panel    Component Value Date/Time   CHOL 291 (H) 11/15/2015 0607   TRIG 96 11/15/2015 0607   HDL 56 11/15/2015 0607   CHOLHDL 5.2 11/15/2015 0607   VLDL 19 11/15/2015 0607   LDLCALC 216 (H) 11/15/2015 0607    Additional studies/ records that were reviewed today include:    Echocardiogram: 11/16/2015 Study Conclusions  - Left ventricle: The cavity size was normal. Wall thickness was   normal. Systolic function was vigorous. The estimated ejection   fraction was in the range of 65% to 70%. The study was not   technically sufficient to allow evaluation of LV diastolic   dysfunction due to atrial fibrillation. - Aortic valve: There was trivial regurgitation. Valve area (VTI):   2.7 cm^2. Valve area (Vmax): 2.46 cm^2. - Left atrium: The atrium was mildly dilated. - Technically adequate study.  Assessment:    1. Accelerated hypertension   2. PAF (paroxysmal atrial fibrillation) ([employer])   3. Hyperlipidemia, unspecified hyperlipidemia type   4. History of CVA (cerebrovascular accident)     Plan:   In order of problems listed above:  1. Accelerated HTN - recently seen on 11/25/2015 and initial BP found to be elevated to 201/96. Was 192/92 on recheck. Was continued on Lopressor 25 mg twice a day and Lisinopril 10 mg daily  was added to his medication regimen. - has not checked his BP at home since the office visit. Says he "has more energy" and denies any headaches, lightheadedness, dizziness or presyncope. - Initial BP today was 170/110 and 168/96 when rechecked by provider. - will increase Lisinopril from 10mg  daily to 20mg  daily. Recent labwork on 10/2 showed stable creatinine of 1.07. Will see back in 4 weeks and recheck BMET at that time. He was instructed to check his BP daily at home and record the results. Will call back with his readings. Can further titrate Lisinopril if needed prior to adding additional medications as the patient prefers to "be on as few of medications as possible".    2. Paroxysmal Atrial Fibrillation - This patients CHA2DS2-VASc Score and unadjusted Ischemic Stroke Rate (% per year) is equal to 4.8 % stroke rate/year from a score of 4 (HTN, Age, CVA(2)). Denies any evidence of active bleeding. Continue Eliquis 5mg  BID. - continue  Lopressor 25mg  BID.   3. HLD - Lipid Panel 11/15/2015 showed total cholesterol 291, HDL 56, and LDL 216. - started on high-intensity Atorvastatin 80mg  daily. - will need repeat Lipid Panel and LFT's in 01/2016.  4. CVA - diagnosed in 10/2015. Found to be in new onset atrial fibrillation and started on NOAC as above. - undergoing PT and OT. Followed by Neurology.     Medication Adjustments/Labs and Tests Ordered: Current medicines are reviewed at length with the patient today.  Concerns regarding medicines are outlined above.  Medication changes, Labs and Tests ordered today are listed in the Patient Instructions below. Patient Instructions  Medication Instructions:   INCREASE LISINOPRIL TO 20 MG ONCE DAILY= 2 OF THE 10 MG TABLETS ONCE DAILY  REFILLS SENT TO THE PHARMACY ELECTRONICALLY  Follow-Up:  Your physician recommends that you schedule a follow-up appointment in: [place] OR [facility]    Signed, Erma Heritage, [place]  12/01/2015 3:04 PM    [facility] [address], [place], [place]  [address] Phone: [phone]; Fax: [phone]  [address], [place], [place] [address] Phone: [phone]

## 2015-12-02 ENCOUNTER — Ambulatory Visit: Payer: Medicare Other | Admitting: Physical Therapy

## 2015-12-02 ENCOUNTER — Ambulatory Visit: Payer: Medicare Other | Admitting: Occupational Therapy

## 2015-12-02 VITALS — BP 200/120 | HR 77

## 2015-12-02 DIAGNOSIS — M6281 Muscle weakness (generalized): Secondary | ICD-10-CM | POA: Diagnosis not present

## 2015-12-02 DIAGNOSIS — R2689 Other abnormalities of gait and mobility: Secondary | ICD-10-CM | POA: Diagnosis not present

## 2015-12-02 DIAGNOSIS — R2681 Unsteadiness on feet: Secondary | ICD-10-CM | POA: Diagnosis not present

## 2015-12-02 DIAGNOSIS — R278 Other lack of coordination: Secondary | ICD-10-CM | POA: Diagnosis not present

## 2015-12-02 DIAGNOSIS — R41842 Visuospatial deficit: Secondary | ICD-10-CM | POA: Diagnosis not present

## 2015-12-02 NOTE — Therapy (Signed)
[facility] [address] [place], [place], [address] Phone: [phone]   Fax:  [phone]  Physical Therapy Treatment  Patient Details  Name: Christian Jennings MRN: [id number] Date of Birth: Feb 10, 1951 Referring Provider: Rosalin Hawking  Encounter Date: 12/02/2015      PT End of Session - 12/02/15 1445    Visit Number 4   Number of Visits 17   Date for PT Re-Evaluation 01/20/16   Authorization Type Medicare primary/BCBS secondary   PT Start Time 1403   PT Stop Time 1425   PT Time Calculation (min) 22 min   Activity Tolerance Treatment limited secondary to medical complications (Comment)   Behavior During Therapy [facility] for tasks assessed/performed      Past Medical History:  Diagnosis Date  . CVA (cerebral vascular accident) ([place])    a. 10/2015 occurring in the setting of new-onset untreated atrial fibrillation. Started on Eliquis.  Marland Kitchen HLD (hyperlipidemia)   . HTN (hypertension)   . PAF (paroxysmal atrial fibrillation) ([place])    a. on Eliquis following CVA in 10/2015.    Past Surgical History:  Procedure Laterality Date  . EYE SURGERY     As child for muscle issue  . EYE SURGERY     Cosmetic for drooping eyelid    Vitals:   12/02/15 1417  BP: (!) 200/120  Pulse: 77        Subjective Assessment - 12/02/15 1408    Subjective Had acupunture yesterday for "stroke therapy".  Also saw MD who changed BP meds.   Patient is accompained by: Family member  wife   Patient Stated Goals Pt's goal is to get back to ultimate goal of 100% recovery.   Currently in Pain? No/denies      Upon arrival to clinic, checked BP in sitting in L UE with automatic cuff.  BP 200/120, HR 77.  Pt denies dizziness, headache or blurred vision.  Pt reports going to MD yesterday who increased his BP meds but only able to take one increased dose this morning.  Did not check his BP this morning but did check it yesterday after MD and got  similar reading as in MD office.  Despite this, pt continues to state that his BP increases from being anxious in MD or PT office.  Reviewed stroke warning signs/symptoms with pt and wife.  Pt and wife report receiving written education information as well.  Instructed pt to check BP at home both before and then 30 minutes after taking medication and to keep a log of BP's.  Instructed to report any increased readings to MD as well as to cancel PT or OT appointments if A999333 systolic or [id number] diastolic.  Pt aware to call 911 if any symptoms/signs of stroke.  Mady Haagensen, PT, alerted after checking BP who agrees with above and also discussed above with pt and wife.        PT Education - 12/02/15 1443    Education provided Yes   Education Details Checking BP at home before and after taking BP meds and keeping a log, need to call and cancel PT or OT appointments if BP > A999333 systolic or 123XX123 diastolic, stroke warning signs/symptoms, need to call 911 if experiences any signs/symptoms of stroke.   Person(s) Educated Patient;Spouse   Methods Explanation   Comprehension Verbalized understanding          PT Short Term Goals - 11/22/15 1509      PT SHORT TERM  GOAL #1   Title Pt will be independent with HEP for strengthening, balance, and gait.  TARGET 12/22/15   Time 4   Period Weeks   Status New     PT SHORT TERM GOAL #2   Title Pt will improve Berg Balance score to at least 45/56 for decreased fall risk.   Time 4   Period Weeks   Status New     PT SHORT TERM GOAL #3   Title Pt will improve TUG score to less than or equal to 13.5 seconds for decreased fall risk.   Time 4   Period Weeks   Status New     PT SHORT TERM GOAL #4   Title Pt will negotiate at least 4 steps, -alternating step pattern with 1 handrail, modified independently.   Time 4   Period Weeks   Status New     PT SHORT TERM GOAL #5   Title Pt/family will verbalize understanding of CVA warning signs/symptoms.   Time 4    Period Weeks   Status New           PT Long Term Goals - 11/22/15 1511      PT LONG TERM GOAL #1   Title Pt/family will verbalize understanding of fall prevention within home environment.  TARGET 01/20/16   Time 8   Period Weeks   Status New     PT LONG TERM GOAL #2   Title Pt will improve Dynamic Gait Index score to at least 19/24 for decreased fall risk.   Time 8   Period Weeks   Status New     PT LONG TERM GOAL #3   Title Pt will improve gait velocity to at least 3.3 ft/sec for improved gait efficiency and safety.   Time 8   Period Weeks   Status New     PT LONG TERM GOAL #4   Title Pt will negotiate at least 4 steps, alternating pattern, no handrail, independently, for improved safety and efficiency with stairs.   Time 8   Period Weeks   Status New     PT LONG TERM GOAL #5   Title Pt will ambulate at least 1000 ft indoor and outdoor surfaces, modified independently, for improved outdoor gait/return to leisure activities.   Time 8   Period Weeks   Status New     Additional Long Term Goals   Additional Long Term Goals Yes     PT LONG TERM GOAL #6   Title Pt will improve Neuro QOL score on FOTO by at least 10% to demonstrate improved functional mobility and recovery.   Time 8   Period Weeks   Status New               Plan - 12/02/15 1446    Clinical Impression Statement Only able to perform self-care today as pt had increased BP upon arrival.  Continue PT per POC monitoring BP during session.   Rehab Potential Good   PT Frequency 2x / week   PT Duration 8 weeks   PT Treatment/Interventions ADLs/Self Care Home Management;Functional mobility training;Stair training;Gait training;DME Instruction;Therapeutic activities;Therapeutic exercise;Balance training;Neuromuscular re-education;Patient/family education;Orthotic Fit/Training;Electrical Stimulation   PT Next Visit Plan Check BP before session and during.  If BP stable, check HEP;  continue LLE  strengthening; high level balance and gait with cues to minimize deviations;     PT Home Exercise Plan LLE strengthening and stretching; dynamic balance exs   Consulted and Agree with Plan  of Care Patient;Family member/caregiver   Family Member Consulted wife      Patient will benefit from skilled therapeutic intervention in order to improve the following deficits and impairments:  Abnormal gait, Decreased balance, Decreased mobility, Decreased coordination, Decreased safety awareness, Decreased strength, Difficulty walking, Postural dysfunction, Impaired tone  Visit Diagnosis: Other abnormalities of gait and mobility     Problem List Patient Active Problem List   Diagnosis Date Noted  . Carotid arterial disease ([place]) 11/25/2015  . Elevated TSH 11/25/2015  . PAF (paroxysmal atrial fibrillation) ([place])   . Accelerated hypertension   . History of CVA (cerebrovascular accident)   . Hyperlipidemia   . Atrial fibrillation with rapid ventricular response ([place]) 11/14/2015    Narda Bonds 12/02/2015, 2:49 PM  [facility] [address] [place], [place], [address] Phone: [phone]   Fax:  [phone]  Name: YADIR KAMAN MRN: [id number] Date of Birth: 1950-08-06  Narda Bonds, [place] 12/02/15 2:56 PM Phone: [phone] Fax: [phone]   PT aware of BP readings and conversation/education with patient and wife.  Agree with education provided.    Mady Haagensen, PT 12/02/15 9:46 PM Phone: [phone] Fax: [phone]

## 2015-12-03 ENCOUNTER — Other Ambulatory Visit: Payer: Self-pay

## 2015-12-03 ENCOUNTER — Encounter: Payer: Medicare Other | Admitting: Occupational Therapy

## 2015-12-03 NOTE — Patient Outreach (Signed)
[facility]) Care Management  12/03/2015  ELVEN GROOME 02/17/1950 [id number]  REFERRAL DATE: 12/03/15  REFERRAL SOURCE:  EMMI stroke transition program REFERRAL REASON:  EMMI stroke red alert for:  NO for follow up appointment and NO for scheduled a follow up appointment CONSENT: self/ patient   PROVIDER:  Dr. London Pepper Dr. Percival Spanish  SOCIAL: Patient lives with his spouse Has transportation to appointments   SUBJECTIVE:  Telephone call to patient regarding EMMI stroke red alert referral. HIPAA verified with patient. Discussed EMMI transition program. Patient verbally agreed to be followed by telephonic case manager Patient confirms he was in the [facility] from 11/14/15 - 11/17/15 for stroke. Patient states he did not have a primary MD prior to being hospitalized. Patient states he has an appointment on 12/24/15 with his new primary MD, Dr. London Pepper.  Patient reports he has seen his cardiologist, Dr. Percival Spanish x 2 since being discharged from the hospital. Patient states his cardiologist started him on a blood pressure medication at his first appointment then increased the medication at his second appointment.  Patient reports his blood pressures ranging 170's/ 100's.  Patient states his blood pressure on yesterday at the outpatient physical therapy was 200/110.  Patient states his appointment was cancelled due to this. Patient states he has been instructed by his cardiologist to monitor his blood pressure and notify the office for concerns or if blood pressure remains high.  Patient states he monitors his blood pressure almost every day.  Patient states he is able to obtain and afford his medication.  Patient states he has transportation to his appointments.  Patient reports he continues with his outpatient physical  And occupational therapy visits as long as his blood pressure is not to high.  Patient denies any new onset of symptoms. Patient states his balance  Is not  completely normal and his walking is slow.  Patient denies any issues with memory, speech, or vision.  Patient verbally agrees to next telephone outreach with [facility].   ASSESSMENT:  Admit date: 11/14/2015 Discharge date: 11/17/2015 Home Health: No - Outpatient PT and OT Equipment/Devices: None  Discharge Condition: Stable and Improved CODE STATUS: FULL Diet recommendation: Heart Healthy Low Salt Home Health: No - Outpatient PT and OT Equipment/Devices: None Discharge Diagnoses:  Principal Problem:   Acute ischemic stroke ([place]) Active Problems:   Atrial fibrillation with rapid ventricular response ([facility])   Essential hypertension   Hyperlipidemia   PAF (paroxysmal atrial fibrillation) ([facility])   HTN (hypertension)   CVA (cerebral vascular accident) ([place]) Acute/Subacute Right Mid Corona Radiata CVA with Foci extending into Right Lentiform Nucleus, Posterior Limb of Internal Capsule, and Caudate body likely Embolic in Nature from Atrial Fibrillation with RVR  PLAN:  RNCM will follow up with patient within 2 weeks.  RNCM will mail patient EMMI education regarding stroke, blood pressure, low salt diet.   Quinn Plowman RN,BSN,CCM [facility] Telephonic  [phone]

## 2015-12-04 ENCOUNTER — Ambulatory Visit: Payer: Medicare Other | Admitting: Physician Assistant

## 2015-12-04 ENCOUNTER — Ambulatory Visit: Payer: Medicare Other | Admitting: Occupational Therapy

## 2015-12-04 ENCOUNTER — Ambulatory Visit: Payer: Medicare Other | Admitting: Physical Therapy

## 2015-12-05 ENCOUNTER — Ambulatory Visit: Payer: Medicare Other | Admitting: Rehabilitation

## 2015-12-08 ENCOUNTER — Ambulatory Visit: Payer: Medicare Other | Admitting: Physical Therapy

## 2015-12-08 ENCOUNTER — Ambulatory Visit: Payer: Medicare Other | Admitting: Occupational Therapy

## 2015-12-10 ENCOUNTER — Ambulatory Visit: Payer: Medicare Other | Admitting: Physical Therapy

## 2015-12-10 ENCOUNTER — Ambulatory Visit: Payer: Medicare Other | Admitting: Occupational Therapy

## 2015-12-10 ENCOUNTER — Other Ambulatory Visit: Payer: Self-pay

## 2015-12-10 NOTE — Patient Outreach (Signed)
[facility]) Care Management  12/10/2015  CORLIS ROSSEN Sep 25, 1950 [id number]  REFERRAL DATE: 12/03/15   REFERRAL SOURCE:  EMMI stroke transition program REFERRAL REASON:  EMMI stroke red alert for:  NO for follow up appointment and NO for scheduled a follow up appointment CONSENT: self/ patient   PROVIDER:  Dr. London Pepper Dr. Percival Spanish  SOCIAL: Patient lives with his spouse Has transportation to appointments   Telephone call to patient regarding EMMI stroke program follow up.  Unable to reach patient. HIPPA compliant voice message left with call back phone number .  PLAN: RNCM will attempt 2nd telephone call to patient within 1 week.  Quinn Plowman RN,BSN,CCM [facility] Telephonic  [phone]

## 2015-12-12 ENCOUNTER — Other Ambulatory Visit: Payer: Self-pay

## 2015-12-12 NOTE — Patient Outreach (Signed)
[facility]) Care Management  12/12/2015  JOSEROBERTO BUNGAY Aug 07, 1950 [id number]  REFERRAL DATE: 12/03/15   REFERRAL SOURCE:  EMMI stroke transition program REFERRAL REASON:  EMMI stroke red alert for:  NO for follow up appointment and NO for scheduled a follow up appointment CONSENT: self/ patient   PROVIDER:  Dr. London Pepper Dr. Percival Spanish  SOCIAL: Patient lives with his spouse Has transportation to appointments   Second telephone call to patient regarding EMMI stroke referral.  Unable to reach.  HIPAA compliant voice message left with call back phone number.Marland Kitchen  PLAN; RNCM will attempt 3rd telephone call to patient within 1 week.  Quinn Plowman RN,BSN,CCM [facility] Telephonic  [phone]

## 2015-12-15 ENCOUNTER — Other Ambulatory Visit: Payer: Self-pay

## 2015-12-15 NOTE — Patient Outreach (Signed)
[name]) Care Management  12/15/2015  Christian Jennings 04/22/1950 [id number]   REFERRAL DATE: 12/03/15 REFERRAL SOURCE: EMMI stroke transition program REFERRAL REASON: EMMI stroke red alert for: NO for follow up appointment and NO for scheduled a follow up appointment CONSENT: self/ patient  Third telephone call to patient regarding EMMI stroke follow up.  [place] to reach patient.  HIPAA compliant voice message left with call back phone.    PLAN:  RNCM will refer patient to case management assistant to close due to being unable to reach .  Quinn Plowman RN,BSN,CCM [facility] Telephonic  [phone]

## 2015-12-16 ENCOUNTER — Ambulatory Visit (INDEPENDENT_AMBULATORY_CARE_PROVIDER_SITE_OTHER): Payer: Medicare Other | Admitting: Nurse Practitioner

## 2015-12-16 ENCOUNTER — Encounter: Payer: Self-pay | Admitting: Nurse Practitioner

## 2015-12-16 VITALS — BP 184/107 | HR 77 | Ht 64.5 in | Wt 146.0 lb

## 2015-12-16 DIAGNOSIS — I10 Essential (primary) hypertension: Secondary | ICD-10-CM | POA: Diagnosis not present

## 2015-12-16 DIAGNOSIS — Z8673 Personal history of transient ischemic attack (TIA), and cerebral infarction without residual deficits: Secondary | ICD-10-CM | POA: Diagnosis not present

## 2015-12-16 DIAGNOSIS — E785 Hyperlipidemia, unspecified: Secondary | ICD-10-CM

## 2015-12-16 DIAGNOSIS — I639 Cerebral infarction, unspecified: Secondary | ICD-10-CM

## 2015-12-16 NOTE — Progress Notes (Signed)
[facility]  PATIENT: Christian Jennings DOB: Mar 06, 1950   REASON FOR VISIT: Hospital follow-up for stroke  HISTORY FROM: Patient and wife    HISTORY OF PRESENT ILLNESS:Christian E Lillyis a 64 y.o.malehistory of left-sided weakness that was  present for 4 days. He states that he was playing tennis, and went to use his left hand to throw up a serve and found that it wasn't working quite right. He then noticed over the course of the past few days that things still seemed to be not quite right including  his left face isn't quite normal. He therefore presented to [facility] today. [place] [facility] [place], he was found to be in atrial fibrillation with rapid ventricular response, and appears to have flipped in and out. He does not notice his heart racing or any other symptoms with this, so it is unclear how long he has had it. MRI Right BG/CR infarcts - embolic secondary to  newly diagnosed atrial fibrillation. MRA - left MCA stenosis, bilateral ICA siphon and bilateral PCAs and left VA atherosclerosis Carotid Doppler - 1-39% ICA plaquing. Vertebral artery flow is antegrade. 2D Echo - EF 65-70%. No cardiac source of emboli identified.LDL - 216. He returns today for hospital follow-up. He is currently on Eliquis for secondary stroke prevention and atrial fibrillation. He is on  Lipitor 80 mg daily. He has had some problems with fluctuations in his blood pressure and his physical therapy is on hold at present. His lisinopril is slowly being increased by cardiology. Blood pressure in the office 184/107. [name] feels that his left sided weakness has improved . His  speech Is without dysarthria. He continues to have mild left facial weakness. He has not had further stroke or TIA symptoms he returns to the clinic for reevaluation  REVIEW OF SYSTEMS: Full 14 system review of systems performed and notable only for those listed, all others are neg:  Constitutional:  neg  Cardiovascular: neg Ear/Nose/Throat: neg  Skin: neg Eyes: neg Respiratory: neg Gastroitestinal: neg  Hematology/Lymphatic: neg  Endocrine: neg Musculoskeletal:neg Allergy/Immunology: neg Neurological: Mild weakness Psychiatric: neg Sleep : neg   ALLERGIES: No Known Allergies  HOME MEDICATIONS: Outpatient Medications Prior to Visit  Medication Sig Dispense Refill  . apixaban (ELIQUIS) 5 MG TABS tablet Take 1 tablet (5 mg total) by mouth 2 (two) times daily. 60 tablet 6  . atorvastatin (LIPITOR) 80 MG tablet Take 1 tablet (80 mg total) by mouth daily at 6 PM. 30 tablet 6  . metoprolol tartrate (LOPRESSOR) 25 MG tablet Take 1 tablet (25 mg total) by mouth 2 (two) times daily. 60 tablet 6  . lisinopril (PRINIVIL,ZESTRIL) 10 MG tablet Take 2 tablets (20 mg total) by mouth daily. 90 tablet 3   No facility-administered medications prior to visit.     PAST MEDICAL HISTORY: Past Medical History:  Diagnosis Date  . CVA (cerebral vascular accident) ([place])    a. 10/2015 occurring in the setting of new-onset untreated atrial fibrillation. Started on Eliquis.  Marland Kitchen HLD (hyperlipidemia)   . HTN (hypertension)   . PAF (paroxysmal atrial fibrillation) ([place])    a. on Eliquis following CVA in 10/2015.    PAST SURGICAL HISTORY: Past Surgical History:  Procedure Laterality Date  . EYE SURGERY     As child for muscle issue  . EYE SURGERY     Cosmetic for drooping eyelid    FAMILY HISTORY: Family History  Problem Relation Age of Onset  .  Transient ischemic attack Mother   . Alcoholism Father   . COPD Father   . Throat cancer Brother     Or esophageal  . Heart attack Maternal Uncle     SOCIAL HISTORY: Social History   Social History  . Marital status: Married    Spouse name: N/A  . Number of children: N/A  . Years of education: N/A   Occupational History  . Not on file.   Social History Main Topics  . Smoking status: Never Smoker  . Smokeless tobacco: Never Used    . Alcohol use No  . Drug use: Unknown  . Sexual activity: Yes    Birth control/ protection: None   Other Topics Concern  . Not on file   Social History Narrative  . No narrative on file     PHYSICAL EXAM  Vitals:   12/16/15 1502  BP: (!) 184/107  Pulse: 77  Weight: 146 lb (66.2 kg)  Height: 5' 4.5" (1.638 m)   Body mass index is 24.67 kg/m.  Generalized: Well developed, in no acute distress  Head: normocephalic and atraumatic,. Oropharynx benign  Neck: Supple, no carotid bruits  Cardiac: Regular rate rhythm, no murmur  Musculoskeletal: No deformity   Neurological examination   Mentation: Alert oriented to time, place, history taking. Attention span and concentration appropriate. Recent and remote memory intact.  Follows all commands speech and language fluent. No dysarthria  Cranial nerve II-XII: Fundoscopic exam reveals sharp disc margins.Pupils were equal round reactive to light extraocular movements were full, visual field were full on confrontational test. Facial strength mild left facial droop . hearing was intact to finger rubbing bilaterally. Uvula tongue midline. head turning and shoulder shrug were normal and symmetric.Tongue protrusion into cheek strength was normal. Motor: normal bulk and tone, full strength in the right upper and lower extremities. Mild left upper extremity proximal and distal weakness. Right lower extremity 4 out of 5.  Sensory: normal and symmetric to light touch, pinprick, and  Vibration, in the upper and lower extremities Coordination: finger-nose-finger, heel-to-shin bilaterally, no dysmetria. No tremor Reflexes: 1+ upper lower and symmetric, plantar responses were flexor bilaterally. Gait and Station: Rising up from seated position without assistance, wide based  stance,  moderate stride, good arm swing, smooth turning, able to perform tiptoe, and heel walking without difficulty. Tandem gait is unsteady. Has  has a mild limp on the left.    DIAGNOSTIC DATA (LABS, IMAGING, TESTING) - I reviewed patient records, labs, notes, testing and imaging myself where available.  Lab Results  Component Value Date   WBC 8.5 11/17/2015   HGB 15.5 11/17/2015   HCT 46.1 11/17/2015   MCV 89.5 11/17/2015   PLT 169 11/17/2015      Component Value Date/Time   NA 142 11/17/2015 0620   K 3.6 11/17/2015 0620   CL 110 11/17/2015 0620   CO2 26 11/17/2015 0620   GLUCOSE 102 (H) 11/17/2015 0620   BUN 20 11/17/2015 0620   CREATININE 1.07 11/17/2015 0620   CALCIUM 9.2 11/17/2015 0620   PROT 6.6 11/17/2015 0620   ALBUMIN 3.8 11/17/2015 0620   AST 21 11/17/2015 0620   ALT 25 11/17/2015 0620   ALKPHOS 46 11/17/2015 0620   BILITOT 1.4 (H) 11/17/2015 0620   GFRNONAA >60 11/17/2015 0620   GFRAA >60 11/17/2015 0620   Lab Results  Component Value Date   CHOL 291 (H) 11/15/2015   HDL 56 11/15/2015   LDLCALC 216 (H) 11/15/2015   TRIG  96 11/15/2015   CHOLHDL 5.2 11/15/2015   Lab Results  Component Value Date   HGBA1C 5.2 11/17/2015   No results found for: VITAMINB12 Lab Results  Component Value Date   TSH 3.55 11/25/2015      ASSESSMENT AND PLAN  65 y.o. year old male  has a past medical history of CVA (cerebral vascular accident) ([place]); HLD (hyperlipidemia); HTN (hypertension); and PAF (paroxysmal atrial fibrillation) ([place]). here For hospital follow-up for stroke    PLAN: Stressed the importance of management of risk factors to prevent further stroke Continue Eliquis for secondary stroke prevention and atrial fibrillation Maintain strict control of hypertension with blood pressure goal below 130/90, today's reading 184/107 continue antihypertensive medications and titrate as directed Cholesterol with LDL cholesterol less than 70, followed by primary care,  most recent 216 continue Lipitor Exercise by walking, slowly increase , eat healthy diet with whole grains,  fresh fruits and vegetables Discussed risk for recurrent stroke/  TIA and answered additional questions Follow-up in 3 months This was a prolonged visit requiring 30 minutes and medical decision making of high complexity with extensive review of history, hospital chart, counseling and answering questions Dennie Bible, [facility], [facility], APRN  [facility] [address], [place], [place] [address] [phone]

## 2015-12-16 NOTE — Progress Notes (Signed)
I reviewed above note and agree with the assessment and plan.  Rosalin Hawking, MD PhD Stroke Neurology 12/16/2015 5:44 PM

## 2015-12-16 NOTE — Patient Instructions (Addendum)
Stressed the importance of management of risk factors to prevent further stroke Continue Eliquis for secondary stroke prevention and atrial fibrillation Maintain strict control of hypertension with blood pressure goal below 130/90, today's reading 184/107 continue antihypertensive medications Cholesterol with LDL cholesterol less than 70, followed by primary care,  most recent 216 continue Lipitor Exercise by walking, slowly increase , eat healthy diet with whole grains,  fresh fruits and vegetables Discussed risk for recurrent stroke/ TIA and answered additional questions Follow-up in 3 months

## 2015-12-17 ENCOUNTER — Ambulatory Visit: Payer: Medicare Other | Admitting: Occupational Therapy

## 2015-12-17 ENCOUNTER — Ambulatory Visit: Payer: Medicare Other | Admitting: Physical Therapy

## 2015-12-17 ENCOUNTER — Telehealth: Payer: Self-pay | Admitting: Nurse Practitioner

## 2015-12-17 NOTE — Telephone Encounter (Signed)
Changed on medication list.  LMVM for wife that received message and changed on medication list.

## 2015-12-17 NOTE — Telephone Encounter (Signed)
Wife returned Sandy's call, just missed her call, relayed message below.

## 2015-12-17 NOTE — Telephone Encounter (Signed)
Wife called to make correction on medication LISINOPRIL, states when here for visit yesterday gave incorrect information, correct information is LISINOPRIL 10 MG tablet, takes 2 a day for a total of 20 MG.

## 2015-12-18 ENCOUNTER — Ambulatory Visit: Payer: Medicare Other | Admitting: Physical Therapy

## 2015-12-18 ENCOUNTER — Ambulatory Visit: Payer: Medicare Other | Admitting: Occupational Therapy

## 2015-12-22 ENCOUNTER — Ambulatory Visit: Payer: Medicare Other | Admitting: Occupational Therapy

## 2015-12-22 ENCOUNTER — Ambulatory Visit: Payer: Medicare Other | Admitting: Physical Therapy

## 2015-12-24 DIAGNOSIS — I10 Essential (primary) hypertension: Secondary | ICD-10-CM | POA: Diagnosis not present

## 2015-12-24 DIAGNOSIS — I639 Cerebral infarction, unspecified: Secondary | ICD-10-CM | POA: Diagnosis not present

## 2015-12-24 DIAGNOSIS — I4891 Unspecified atrial fibrillation: Secondary | ICD-10-CM | POA: Diagnosis not present

## 2015-12-24 DIAGNOSIS — E785 Hyperlipidemia, unspecified: Secondary | ICD-10-CM | POA: Diagnosis not present

## 2015-12-24 DIAGNOSIS — I480 Paroxysmal atrial fibrillation: Secondary | ICD-10-CM | POA: Diagnosis not present

## 2015-12-25 ENCOUNTER — Ambulatory Visit: Payer: Medicare Other | Admitting: Physical Therapy

## 2015-12-25 ENCOUNTER — Ambulatory Visit: Payer: Medicare Other | Admitting: Occupational Therapy

## 2015-12-25 LAB — LIPID PANEL
Cholesterol: 98 mg/dL (ref <200)
HDL: 44 mg/dL (ref >40)
LDL Cholesterol: 44 mg/dL
Total CHOL/HDL Ratio: 2.2 Ratio (ref <5.0)
Triglycerides: 51 mg/dL (ref <150)
VLDL: 10 mg/dL (ref <30)

## 2015-12-25 LAB — COMPREHENSIVE METABOLIC PANEL
ALT: 40 U/L (ref 9–46)
AST: 26 U/L (ref 10–35)
Albumin: 4.4 g/dL (ref 3.6–5.1)
Alkaline Phosphatase: 64 U/L (ref 40–115)
BUN: 18 mg/dL (ref 7–25)
CO2: 28 mmol/L (ref 20–31)
Calcium: 9.7 mg/dL (ref 8.6–10.3)
Chloride: 107 mmol/L (ref 98–110)
Creat: 0.96 mg/dL (ref 0.70–1.25)
Glucose, Bld: 87 mg/dL (ref 65–99)
Potassium: 4.5 mmol/L (ref 3.5–5.3)
Sodium: 141 mmol/L (ref 135–146)
Total Bilirubin: 1.1 mg/dL (ref 0.2–1.2)
Total Protein: 6.9 g/dL (ref 6.1–8.1)

## 2015-12-31 ENCOUNTER — Encounter: Payer: Medicare Other | Admitting: Occupational Therapy

## 2015-12-31 ENCOUNTER — Ambulatory Visit: Payer: Medicare Other | Admitting: Physical Therapy

## 2016-01-01 ENCOUNTER — Ambulatory Visit: Payer: Medicare Other | Admitting: Physical Therapy

## 2016-01-01 ENCOUNTER — Encounter: Payer: Medicare Other | Admitting: Occupational Therapy

## 2016-01-02 ENCOUNTER — Ambulatory Visit (INDEPENDENT_AMBULATORY_CARE_PROVIDER_SITE_OTHER): Payer: Medicare Other | Admitting: Cardiology

## 2016-01-02 ENCOUNTER — Encounter: Payer: Self-pay | Admitting: Cardiology

## 2016-01-02 VITALS — BP 186/116 | HR 98 | Ht 64.5 in | Wt 143.0 lb

## 2016-01-02 DIAGNOSIS — I4891 Unspecified atrial fibrillation: Secondary | ICD-10-CM | POA: Diagnosis not present

## 2016-01-02 DIAGNOSIS — Z79899 Other long term (current) drug therapy: Secondary | ICD-10-CM | POA: Diagnosis not present

## 2016-01-02 DIAGNOSIS — E785 Hyperlipidemia, unspecified: Secondary | ICD-10-CM

## 2016-01-02 DIAGNOSIS — Z8673 Personal history of transient ischemic attack (TIA), and cerebral infarction without residual deficits: Secondary | ICD-10-CM | POA: Diagnosis not present

## 2016-01-02 DIAGNOSIS — I10 Essential (primary) hypertension: Secondary | ICD-10-CM

## 2016-01-02 DIAGNOSIS — I639 Cerebral infarction, unspecified: Secondary | ICD-10-CM

## 2016-01-02 MED ORDER — AMLODIPINE BESYLATE 5 MG PO TABS: 5.0000 mg | ORAL_TABLET | Freq: Every day | ORAL | 3 refills | Status: DC

## 2016-01-02 MED ORDER — LISINOPRIL 40 MG PO TABS: 40.0000 mg | ORAL_TABLET | Freq: Every day | ORAL | 3 refills | Status: DC

## 2016-01-02 NOTE — Patient Instructions (Addendum)
Medication Instructions:  INCREASE- Lisinopril 40 mg daily  START- Amlodipine 5 mg daily  Labwork: Fasting lipids and CMP  Testing/Procedures: None Ordered  Follow-Up: Your physician recommends that you schedule a follow-up appointment in: Keep appointment with Dr Percival Spanish on January 15 th @ 2:00 pm  Your physician recommends that you schedule a follow-up appointment in: 2 weeks blood pressure check   Any Other Special Instructions Will Be Listed Below (If Applicable).      Happy Thanksgiving   If you need a refill on your cardiac medications before your next appointment, please call your pharmacy.

## 2016-01-02 NOTE — Progress Notes (Signed)
01/02/2016 Christian Jennings   Nov 23, 1950  [id number]  Primary Physician Christian Pepper, MD Primary Cardiologist: Dr Christian Jennings  HPI:  65 y.o.malewith no previous medical history (likley by virtue of not seeing a medical provider) who presented to [facility] on 11/14/15 with left sided weakness. He was found to have afib with RVR as well as an acute CVA. Cardiology was consulted for help managing atrial fibrillation. The patient hadn't seen a physician in over 20 years and was not taking any medications. He converted to NSR after he was given Metoprolol. His CHADs VASc is 2 and he was placed on Eliquis. Echo showed preserved LVF-65-70%. When seen in f/u in Oct his B/P was elevated and Lisinopril was added. This was later increased to 20 mg, then 30 mg. HCTZ was added a few days ago by his PCP. His B/P remains high. His wife has a log of his HR and B/P at home. His HR is usually in the 0000000 and systolic B/P [id number]. Otherwise he feels well. His Lt arm weakness is improving. He is taking several dietary supplements and brought in some research data from the [facility] on HTN. Today he had 1/2 a glass of wine before this visit thinking it would help his B/P.    Current Outpatient Prescriptions  Medication Sig Dispense Refill  . apixaban (ELIQUIS) 5 MG TABS tablet Take 1 tablet (5 mg total) by mouth 2 (two) times daily. 60 tablet 6  . atorvastatin (LIPITOR) 80 MG tablet Take 1 tablet (80 mg total) by mouth daily at 6 PM. 30 tablet 6  . hydrochlorothiazide (MICROZIDE) 12.5 MG capsule Take 12.5 mg by mouth daily.    Marland Kitchen lisinopril (PRINIVIL,ZESTRIL) 40 MG tablet Take 1 tablet (40 mg total) by mouth daily. 90 tablet 3  . metoprolol tartrate (LOPRESSOR) 25 MG tablet Take 1 tablet (25 mg total) by mouth 2 (two) times daily. 60 tablet 6  . amLODipine (NORVASC) 5 MG tablet Take 1 tablet (5 mg total) by mouth daily. 90 tablet 3   No current facility-administered medications for this visit.     No Known  Allergies  Social History   Social History  . Marital status: Married    Spouse name: N/A  . Number of children: N/A  . Years of education: N/A   Occupational History  . Not on file.   Social History Main Topics  . Smoking status: Never Smoker  . Smokeless tobacco: Never Used  . Alcohol use No  . Drug use: Unknown  . Sexual activity: Yes    Birth control/ protection: None   Other Topics Concern  . Not on file   Social History Narrative  . No narrative on file     Review of Systems: General: negative for chills, fever, night sweats or weight changes.  Cardiovascular: negative for chest pain, dyspnea on exertion, edema, orthopnea, palpitations, paroxysmal nocturnal dyspnea or shortness of breath Dermatological: negative for rash Respiratory: negative for cough or wheezing Urologic: negative for hematuria Abdominal: negative for nausea, vomiting, diarrhea, bright red blood per rectum, melena, or hematemesis Neurologic: negative for visual changes, syncope, or dizziness All other systems reviewed and are otherwise negative except as noted above.    Blood pressure (!) 186/116, pulse 98, height 5' 4.5" (1.638 m), weight 143 lb (64.9 kg).  General appearance: alert, cooperative and no distress Extremities: extremities normal, atraumatic, no cyanosis or edema Skin: Skin color, texture, turgor normal. No rashes or lesions Neurologic: Grossly normal  EKG NSR-96  ASSESSMENT AND PLAN:   Atrial fibrillation with rapid ventricular response (HCC) Maintaining NSR  History of CVA (cerebrovascular accident) Rt brain CVA 11/14/15  Accelerated hypertension I reviewed his B/P readings from home- pretty consistent > [id number] systolic. Lisinopril increased to 20 mg, then 30 mg, then HCTZ 12.5 mg added 11/8 by PCP. He tells me he had 1/2 glass of red wine before this visit. He doesn't usually drink ETOH, he read it may help heart problems.  Hyperlipidemia His LDL went from > 200 to 44.  I think one of these levels is inaccurate and I asked him to continue Lipitor 80 mg and have this re checked before he sees Dr Christian Jennings in Jan   PLAN  I suggested we increase his Lisinopril to 40 mg and add Amlodipine 5 mg. He'll return for a repeat B/P in two weeks. I suspect one of his lipid panels is an error, continue Lipitor 80 mg and re check in Jan.   Christian Ransom PA-C 01/02/2016 3:52 PM

## 2016-01-02 NOTE — Assessment & Plan Note (Signed)
His LDL went from > 200 to 44. I think one of these levels is inaccurate and I asked him to continue Lipitor 80 mg and have this re checked before he sees Dr Percival Spanish in Jan

## 2016-01-02 NOTE — Assessment & Plan Note (Signed)
Rt brain CVA 11/14/15

## 2016-01-02 NOTE — Assessment & Plan Note (Signed)
I reviewed his B/P readings from home- pretty consistent > Q000111Q systolic. Lisinopril increased to 20 mg, then 30 mg, then HCTZ 12.5 mg added 11/8 by PCP. He tells me he had 1/2 glass of red wine before this visit. He doesn't usually drink ETOH, he read it may help heart problems.

## 2016-01-05 ENCOUNTER — Ambulatory Visit: Payer: Medicare Other | Admitting: Physical Therapy

## 2016-01-05 ENCOUNTER — Ambulatory Visit: Payer: Medicare Other | Admitting: Occupational Therapy

## 2016-01-06 ENCOUNTER — Encounter: Payer: Medicare Other | Admitting: Occupational Therapy

## 2016-01-06 ENCOUNTER — Ambulatory Visit: Payer: Medicare Other | Admitting: Physical Therapy

## 2016-01-07 ENCOUNTER — Ambulatory Visit: Payer: Medicare Other | Admitting: Physical Therapy

## 2016-01-14 ENCOUNTER — Ambulatory Visit: Payer: Medicare Other | Admitting: Occupational Therapy

## 2016-01-14 ENCOUNTER — Ambulatory Visit: Payer: Medicare Other | Admitting: Physical Therapy

## 2016-01-15 ENCOUNTER — Other Ambulatory Visit: Payer: Self-pay | Admitting: Family Medicine

## 2016-01-15 ENCOUNTER — Ambulatory Visit: Payer: Medicare Other | Admitting: Physical Therapy

## 2016-01-15 ENCOUNTER — Encounter: Payer: Medicare Other | Admitting: Occupational Therapy

## 2016-01-15 DIAGNOSIS — I10 Essential (primary) hypertension: Secondary | ICD-10-CM

## 2016-01-21 ENCOUNTER — Ambulatory Visit: Payer: Medicare Other

## 2016-01-21 DIAGNOSIS — I10 Essential (primary) hypertension: Secondary | ICD-10-CM | POA: Diagnosis not present

## 2016-01-22 DIAGNOSIS — I10 Essential (primary) hypertension: Secondary | ICD-10-CM | POA: Diagnosis not present

## 2016-01-23 ENCOUNTER — Ambulatory Visit: Payer: Medicare Other

## 2016-01-26 ENCOUNTER — Ambulatory Visit
Admission: RE | Admit: 2016-01-26 | Discharge: 2016-01-26 | Disposition: A | Discharge status: Home / Self Care | Payer: Medicare Other | Source: Ambulatory Visit | Attending: Family Medicine | Admitting: Family Medicine

## 2016-01-26 DIAGNOSIS — R93429 Abnormal radiologic findings on diagnostic imaging of unspecified kidney: Secondary | ICD-10-CM | POA: Diagnosis not present

## 2016-01-26 DIAGNOSIS — I10 Essential (primary) hypertension: Secondary | ICD-10-CM | POA: Diagnosis not present

## 2016-01-29 ENCOUNTER — Ambulatory Visit (INDEPENDENT_AMBULATORY_CARE_PROVIDER_SITE_OTHER): Payer: Medicare Other | Admitting: Pharmacist Clinician (PhC)/ Clinical Pharmacy Specialist

## 2016-01-29 DIAGNOSIS — I10 Essential (primary) hypertension: Secondary | ICD-10-CM | POA: Insufficient documentation

## 2016-01-29 DIAGNOSIS — I639 Cerebral infarction, unspecified: Secondary | ICD-10-CM | POA: Diagnosis not present

## 2016-01-29 MED ORDER — AMLODIPINE BESYLATE 10 MG PO TABS: 10.0000 mg | ORAL_TABLET | Freq: Every day | ORAL | 3 refills | Status: DC

## 2016-01-29 NOTE — Patient Instructions (Addendum)
Increase amlodipine dose to 10mg  daily  Continue atorvastatin and all other medication as prescribed  Continue home blood pressure monitoring  Follow up with Dr Orland Mustard for addiotional tests requests  Follow up with hypertension clinic as needed     Hypertension Hypertension, commonly called high blood pressure, is when the force of blood pumping through your arteries is too strong. Your arteries are the blood vessels that carry blood from your heart throughout your body. A blood pressure reading consists of a higher number over a lower number, such as 110/72. The higher number (systolic) is the pressure inside your arteries when your heart pumps. The lower number (diastolic) is the pressure inside your arteries when your heart relaxes. Ideally you want your blood pressure below 120/80. Hypertension forces your heart to work harder to pump blood. Your arteries may become narrow or stiff. Having untreated or uncontrolled hypertension can cause heart attack, stroke, kidney disease, and other problems. What increases the risk? Some risk factors for high blood pressure are controllable. Others are not. Risk factors you cannot control include:  Race. You may be at higher risk if you are African American.  Age. Risk increases with age.  Gender. Men are at higher risk than women before age 48 years. After age 17, women are at higher risk than men. Risk factors you can control include:  Not getting enough exercise or physical activity.  Being overweight.  Getting too much fat, sugar, calories, or salt in your diet.  Drinking too much alcohol. What are the signs or symptoms? Hypertension does not usually cause signs or symptoms. Extremely high blood pressure (hypertensive crisis) may cause headache, anxiety, shortness of breath, and nosebleed. How is this diagnosed? To check if you have hypertension, your health care provider will measure your blood pressure while you are seated, with your  arm held at the level of your heart. It should be measured at least twice using the same arm. Certain conditions can cause a difference in blood pressure between your right and left arms. A blood pressure reading that is higher than normal on one occasion does not mean that you need treatment. If it is not clear whether you have high blood pressure, you may be asked to return on a different day to have your blood pressure checked again. Or, you may be asked to monitor your blood pressure at home for 1 or more weeks. How is this treated? Treating high blood pressure includes making lifestyle changes and possibly taking medicine. Living a healthy lifestyle can help lower high blood pressure. You may need to change some of your habits. Lifestyle changes may include:  Following the DASH diet. This diet is high in fruits, vegetables, and whole grains. It is low in salt, red meat, and added sugars.  Keep your sodium intake below 2,300 mg per day.  Getting at least 30-45 minutes of aerobic exercise at least 4 times per week.  Losing weight if necessary.  Not smoking.  Limiting alcoholic beverages.  Learning ways to reduce stress. Your health care provider may prescribe medicine if lifestyle changes are not enough to get your blood pressure under control, and if one of the following is true:  You are 51-62 years of age and your systolic blood pressure is above 140.  You are 57 years of age or older, and your systolic blood pressure is above 150.  Your diastolic blood pressure is above 90.  You have diabetes, and your systolic blood pressure is over 140  or your diastolic blood pressure is over 90.  You have kidney disease and your blood pressure is above 140/90.  You have heart disease and your blood pressure is above 140/90. Your personal target blood pressure may vary depending on your medical conditions, your age, and other factors. Follow these instructions at home:  Have your blood  pressure rechecked as directed by your health care provider.  Take medicines only as directed by your health care provider. Follow the directions carefully. Blood pressure medicines must be taken as prescribed. The medicine does not work as well when you skip doses. Skipping doses also puts you at risk for problems.  Do not smoke.  Monitor your blood pressure at home as directed by your health care provider. Contact a health care provider if:  You think you are having a reaction to medicines taken.  You have recurrent headaches or feel dizzy.  You have swelling in your ankles.  You have trouble with your vision. Get help right away if:  You develop a severe headache or confusion.  You have unusual weakness, numbness, or feel faint.  You have severe chest or abdominal pain.  You vomit repeatedly.  You have trouble breathing. This information is not intended to replace advice given to you by your health care provider. Make sure you discuss any questions you have with your health care provider. Document Released: 02/01/2005 Document Revised: 07/10/2015 Document Reviewed: 11/24/2012 Elsevier Interactive Patient Education  2017 [employer].

## 2016-01-29 NOTE — Progress Notes (Signed)
Patient ID: Christian Jennings                 DOB: 1950-05-24                      MRN: [id number]     HPI: Christian Jennings is a 65 y.o. male referred by Christian Ransom PA-C to HTN clinic. PMH positive for CVA on 10/2015, Afib,  hypertension, and hyperlipidemia.  Lisinopril dose was increased to 40mg  on 01/02/16, and amlodipine 5mg  was added to therapy on the same day.  Patient presents to clinic today with his wife.  Patient is very anxious and preoccupied with his recent health changes.  Refuses BP measurement today in clinic but provided extensive BP records from home.  Stated "I am sure I have a problem with my adrenal grand or pituitary".  Multile questions about pharmacotherapy at this time. No problems with headaches, dizziness , fainting or swelling.  Patient also interested in rescheduling appointment with cardiologist to see him ASAP.      Current HTN meds: lisinopril 40mg  (01/02/16 increase) , amlodipine 5mg  (03/04/15 new) , metoprolol tartrate 25mg  BID, hydrochlorothiazide 12.5mg  daily  Previously tried: lisinopril 10mg  - 30mg   BP goal: 130/80  Family History: unremarkable   Social History: negative for alcohol , smoke or illicit drugs   Home BP readings: 15 reading; 161/97 average with range 124-203/79-123  Wt Readings from Last 3 Encounters:  01/02/16 143 lb (64.9 kg)  12/16/15 146 lb (66.2 kg)  12/01/15 147 lb (66.7 kg)   BP Readings from Last 3 Encounters:  01/02/16 (!) 186/116  12/16/15 (!) 184/107  12/02/15 (!) 200/120   Pulse Readings from Last 3 Encounters:  01/02/16 98  12/16/15 77  12/02/15 77    Past Medical History:  Diagnosis Date  . CVA (cerebral vascular accident) ([place])    a. 10/2015 occurring in the setting of new-onset untreated atrial fibrillation. Started on Eliquis.  Marland Kitchen HLD (hyperlipidemia)   . HTN (hypertension)   . PAF (paroxysmal atrial fibrillation) ([place])    a. on Eliquis following CVA in 10/2015.    Current Outpatient Prescriptions on File  Prior to Visit  Medication Sig Dispense Refill  . amLODipine (NORVASC) 5 MG tablet Take 1 tablet (5 mg total) by mouth daily. 90 tablet 3  . apixaban (ELIQUIS) 5 MG TABS tablet Take 1 tablet (5 mg total) by mouth 2 (two) times daily. 60 tablet 6  . atorvastatin (LIPITOR) 80 MG tablet Take 1 tablet (80 mg total) by mouth daily at 6 PM. 30 tablet 6  . hydrochlorothiazide (MICROZIDE) 12.5 MG capsule Take 12.5 mg by mouth daily.    Marland Kitchen lisinopril (PRINIVIL,ZESTRIL) 40 MG tablet Take 1 tablet (40 mg total) by mouth daily. 90 tablet 3  . metoprolol tartrate (LOPRESSOR) 25 MG tablet Take 1 tablet (25 mg total) by mouth 2 (two) times daily. 60 tablet 6   No current facility-administered medications on file prior to visit.     No Known Allergies   Assessment/Plan:  1. Hypertension -  Patient refused BP monitoring in office ,but provided records of 15 reading from home monitor.  Broad range of blood pressure readings noted but significantly improved once amlodipine initiated.  Average BP reading prior to  amlodipine 5mg  was 186/110 with higher reading of 203/110.  Average documented BP reading after amlodipine 5mg  started was 144/89.  Will increase amlodipine to 10mg  daily, and continue  home BP monioring. Recent BMET, and  lipid panel ordered by cardiologist; therefore no additional blood work needed at this time.  Patient received counseling related to atorvastatin use, and encourage to contact PCP (Dr Christian Jennings) for additional testing requirements.  Defer to scheduler for Cardiologist appointment changes.   Patient to follow up with Cardiologist and PCP within next 3 weeks.  Follow up with HTN clinic as needed .  Christian Jennings PharmD, [place] [facility] [address] 01/29/2016 9:31 PM

## 2016-02-02 ENCOUNTER — Encounter: Payer: Medicare Other | Admitting: Physician Assistant

## 2016-02-02 ENCOUNTER — Encounter: Payer: Self-pay | Admitting: Internal Medicine

## 2016-02-02 ENCOUNTER — Ambulatory Visit (INDEPENDENT_AMBULATORY_CARE_PROVIDER_SITE_OTHER): Payer: Medicare Other | Admitting: Internal Medicine

## 2016-02-02 VITALS — BP 160/70 | HR 68 | Ht 64.5 in

## 2016-02-02 DIAGNOSIS — I639 Cerebral infarction, unspecified: Secondary | ICD-10-CM

## 2016-02-02 DIAGNOSIS — I10 Essential (primary) hypertension: Secondary | ICD-10-CM

## 2016-02-02 DIAGNOSIS — I480 Paroxysmal atrial fibrillation: Secondary | ICD-10-CM | POA: Diagnosis not present

## 2016-02-02 NOTE — Progress Notes (Signed)
ELECTROPHYSIOLOGY CONSULT NOTE  Patient ID: Christian Jennings, MRN: [id number], DOB/AGE: 65-Nov-1952 65 y.o. Admit date: (Not on file) Date of Consult: 02/04/2016  Primary Physician: London Pepper, MD Primary Cardiologist [facility] Consulting Physician none  Chief Complaint: AFib and labile blood pressure    HPI Christian Jennings is a 65 y.o. male  Long-time family friend's who called me last week because of "A. fib attacks".  He had presented 9/17 with a stroke and found to be in atrial fibrillation. This occurred in the context of long-standing not treated blood pressure.   Echocardiogram at that time showed normal LV function and mild left atrial enlargement  and surprising absence of left ventricular hypertrophy  He has had poorly controlled blood pressure. He has been managed most recently with a combination of amlodipine and lisinopril hydrochlorothiazide and metoprolol with blood pressures in the 130-40 range.  He has been concerned about episodes of flushing associated with more severe hypertension and tachycardia. He has also been concern about renal artery stenosis and his primary care physician undertook renovascular ultrasound Demonstrating a 82 or percent or greater stenosis; radiology recommended CTA. A 24-hour urine sample has   just been collected presumably for fractionated metanephrines although I do not see it as a pending lab test.  He continues with some fatigue.  He brings in multiple sheets of vital signs and questions.     Past Medical History:  Diagnosis Date  . CVA (cerebral vascular accident) ([place])    a. 10/2015 occurring in the setting of new-onset untreated atrial fibrillation. Started on Eliquis.  Marland Kitchen HLD (hyperlipidemia)   . HTN (hypertension)   . PAF (paroxysmal atrial fibrillation) ([place])    a. on Eliquis following CVA in 10/2015.      Surgical History:  Past Surgical History:  Procedure Laterality Date  . EYE SURGERY     As child for muscle issue  .  EYE SURGERY     Cosmetic for drooping eyelid     Home Meds: Prior to Admission medications   Medication Sig Start Date End Date Taking? Authorizing Provider  amLODipine (NORVASC) 5 MG tablet Take 5 mg by mouth daily. 01/05/16  Yes Historical Provider, MD  apixaban (ELIQUIS) 5 MG TABS tablet Take 1 tablet (5 mg total) by mouth 2 (two) times daily. 12/01/15  Yes Erma Heritage, PA  atorvastatin (LIPITOR) 80 MG tablet Take 1 tablet (80 mg total) by mouth daily at 6 PM. 12/01/15  Yes Erma Heritage, PA  hydrochlorothiazide (MICROZIDE) 12.5 MG capsule Take 12.5 mg by mouth daily.   Yes Historical Provider, MD  lisinopril (PRINIVIL,ZESTRIL) 40 MG tablet Take 1 tablet (40 mg total) by mouth daily. 01/02/16  Yes Luke K Kilroy, PA-C  metoprolol tartrate (LOPRESSOR) 25 MG tablet Take 1 tablet (25 mg total) by mouth 2 (two) times daily. 12/01/15  Yes Erma Heritage, PA    Allergies: No Known Allergies  Social History   Social History  . Marital status: Married    Spouse name: N/A  . Number of children: N/A  . Years of education: N/A   Occupational History  . Not on file.   Social History Main Topics  . Smoking status: Never Smoker  . Smokeless tobacco: Never Used  . Alcohol use No  . Drug use: Unknown  . Sexual activity: Yes    Birth control/ protection: None   Other Topics Concern  . Not on file   Social History Narrative  .  No narrative on file     Family History  Problem Relation Age of Onset  . Transient ischemic attack Mother   . Alcoholism Father   . COPD Father   . Throat cancer Brother     Or esophageal  . Heart attack Maternal Uncle      ROS:  Please see the history of present illness.     All other systems reviewed and negative.    Physical Exam:  Blood pressure (!) 160/70, pulse 68, height 5' 4.5" (1.638 m). General: Well developed, well nourished male in no acute distress. Head: Normocephalic, atraumatic, sclera non-icteric, no xanthomas, nares  are without discharge. EENT: normal  Lymph Nodes:  none Neck: Negative for carotid bruits. JVD not elevated. Back:without scoliosis kyphosis  Lungs: Clear bilaterally to auscultation without wheezes, rales, or rhonchi. Breathing is unlabored. Heart: RRR with   not infrequent premature beats with S1 S2. No  * murmur . No rubs, or gallops appreciated. Abdomen: Soft, non-tender, non-distended with normoactive bowel sounds. No hepatomegaly. No rebound/guarding. No obvious abdominal masses. Msk:  Strength and tone appear normal for age. Extremities: No clubbing or cyanosis. No*  edema.  Distal pedal pulses are 2+ and equal bilaterally. Skin: Warm and Dry Neuro: Alert and oriented X 3. CN III-XII intact Grossly normal sensory and motor function . Mild L sided weakness Psych:  Responds to questions appropriately with a normal affect.      Labs: Cardiac Enzymes No results for input(s): CKTOTAL, CKMB, TROPONINI in the last 72 hours. CBC Lab Results  Component Value Date   WBC 8.5 11/17/2015   HGB 15.5 11/17/2015   HCT 46.1 11/17/2015   MCV 89.5 11/17/2015   PLT 169 11/17/2015   PROTIME: No results for input(s): LABPROT, INR in the last 72 hours. Chemistry No results for input(s): NA, K, CL, CO2, BUN, CREATININE, CALCIUM, PROT, BILITOT, ALKPHOS, ALT, AST, GLUCOSE in the last 168 hours.  Invalid input(s): LABALBU Lipids Lab Results  Component Value Date   CHOL 98 12/24/2015   HDL 44 12/24/2015   LDLCALC 44 12/24/2015   TRIG 51 12/24/2015   BNP No results found for: PROBNP Thyroid Function Tests: No results for input(s): TSH, T4TOTAL, T3FREE, THYROIDAB in the last 72 hours.  Invalid input(s): FREET3 Miscellaneous No results found for: DDIMER  Radiology/Studies:  US Renal  Result Date: February 22, 2016 CLINICAL DATA:  Uncontrolled hypertension. EXAM: RENAL/URINARY TRACT ULTRASOUND RENAL DUPLEX DOPPLER ULTRASOUND COMPARISON:  None. FINDINGS: Right Kidney: Length: 11.5 cm.  Echogenicity within normal limits. No mass or hydronephrosis visualized. Left Kidney: Length: 11.7 cm. Echogenicity within normal limits. No mass or hydronephrosis visualized. Bladder: Normal degree of distension. Bilateral ureteral jets are noted. RENAL DUPLEX ULTRASOUND Right Renal Artery Velocities: Origin:  157 cm/sec Mid:  129 cm/sec Hilum:  104 cm/sec Interlobar:  56 cm/sec Arcuate:  27 Cm/sec Left Renal Artery Velocities: Origin:  263 cm/sec Mid:  169 cm/sec Hilum:  178 cm/sec Interlobar:  33 cm/sec Arcuate:  20 cm/sec Aortic Velocity:  65 Cm/sec Right Renal-Aortic Ratios: Origin: 2.42 Mid:  1.99 Hilum: 1.60 Interlobar: 0.87 Arcuate: 0.42 Left Renal-Aortic Ratios: Origin: 4.05 Mid: 2.60 Hilum: 2.74 Interlobar: 0.51 Arcuate: 0.51 IMPRESSION: No hydronephrosis or renal obstruction is noted. Kidneys appear to be normal in size and appearance. Elevated peak systolic velocity is noted at origin of left renal artery consistent with 60% or greater stenotic lesion. CT angiography of the renal arteries is recommended for further evaluation. Electronically Signed   By: Sabino Dick  Brooke Bonito, M.D.   On: 01/26/2016 11:33   US Renal Artery Stenosis  Result Date: 01/26/2016 CLINICAL DATA:  Uncontrolled hypertension. EXAM: RENAL/URINARY TRACT ULTRASOUND RENAL DUPLEX DOPPLER ULTRASOUND COMPARISON:  None. FINDINGS: Right Kidney: Length: 11.5 cm. Echogenicity within normal limits. No mass or hydronephrosis visualized. Left Kidney: Length: 11.7 cm. Echogenicity within normal limits. No mass or hydronephrosis visualized. Bladder: Normal degree of distension. Bilateral ureteral jets are noted. RENAL DUPLEX ULTRASOUND Right Renal Artery Velocities: Origin:  157 cm/sec Mid:  129 cm/sec Hilum:  104 cm/sec Interlobar:  56 cm/sec Arcuate:  27 Cm/sec Left Renal Artery Velocities: Origin:  263 cm/sec Mid:  169 cm/sec Hilum:  178 cm/sec Interlobar:  33 cm/sec Arcuate:  20 cm/sec Aortic Velocity:  65 Cm/sec Right Renal-Aortic Ratios:  Origin: 2.42 Mid:  1.99 Hilum: 1.60 Interlobar: 0.87 Arcuate: 0.42 Left Renal-Aortic Ratios: Origin: 4.05 Mid: 2.60 Hilum: 2.74 Interlobar: 0.51 Arcuate: 0.51 IMPRESSION: No hydronephrosis or renal obstruction is noted. Kidneys appear to be normal in size and appearance. Elevated peak systolic velocity is noted at origin of left renal artery consistent with 60% or greater stenotic lesion. CT angiography of the renal arteries is recommended for further evaluation. Electronically Signed   By: Marijo Conception, M.D.   On: 01/26/2016 11:33    EKG: not obtained    Assessment and Plan:  Paroxysmal atrial fibrillation  Palpitations  Hypertension-difficult to control  Renal artery stenosis greater than or equal to 60%  Flushing episodes with tachycardia and hypertension    Mr. Mancini has paroxysms of atrial fibrillation, but his more recently identified palpitation syndrome is more likely PVCs which was notable on examination today.  I have suggested the use of an [employer]ive[employer] monitor to try to clarify the mechanism of his palpitations. This is also potentially will inform him him regarding the frequency of his atrial fibrillation.  He is on apixoban correctly dose. We have arranged for him to get some samples today.  His blood pressure issues require further evaluation. As noted, the renal vascular ultrasound recommended CTA. This should be done and will defer this to his primary care physician. In addition, the Flushing episodes with tachycardia and hypertension are worrisome for pheochromocytoma not withstanding its rarity. Fractionated metanephrines from the urine and potentially from the blood would be appropriate.  We also discussed the level of anxiety and appreciate the part of this relates to the frustration that derives from him feeling like he is having to direct his diagnostic testing which to his credit he has done quite well.  We will be glad to see him as needed going forward he is to  follow-up with Dr. [facility] in a couple of weeks    Virl Axe

## 2016-02-02 NOTE — Progress Notes (Deleted)
Cardiology Office Note    Date:  02/02/2016   ID:  Christian Jennings, DOB 08/03/1950, MRN [id number]  PCP:  London Pepper, MD  Cardiologist:  Dr. Percival Spanish  Chief Complaint  Patient presents with  . Follow-up    seen for Dr. Percival Spanish, uncontrolled BP    History of Present Illness:  Christian Jennings is a 65 y.o. male with PMH of HTN, HLD, PAF and CVA. He presented to [facility] in December 2017 with left-sided weakness and found her to have acute CVA as well as atrial fibrillation with RVR. He had no known cardiac history prior to that. He converted to sinus rhythm on metoprolol. Given his CHA2DS2-Vasc score of 2, he was placed on eliquis. Echocardiogram showed preserved EF 65-70%. On follow-up in October, his blood pressure was elevated, he was started on lisinopril and the dose was up titrated. In order to further control high blood pressure, hydrochlorothiazide was added by his PCP. His last office visit was on 01/02/2016, his blood pressure was elevated at the time, he did drink half a glass of wine prior to the visit. His lisinopril was increased to 40 mg daily and amlodipine 5 mg daily was added to his medical regimen. Also noted his LDL is currently changed from greater than 200 down to 44, this level was started be accurate at the plan is to recheck prior to his January visit.   He was seen in the hypertension clinic on 01/29/2016, he actually refused blood pressure check in the office provider record of 15 readings from home monitor. It seems based on his home blood pressure monitor, his blood pressure did significantly improve after addition of amlodipine, with his current blood pressure ranging down in the 140s his amlodipine was increased to 10 mg daily.   No EKG    Past Medical History:  Diagnosis Date  . CVA (cerebral vascular accident) ([place])    a. 10/2015 occurring in the setting of new-onset untreated atrial fibrillation. Started on Eliquis.  Marland Kitchen HLD (hyperlipidemia)     . HTN (hypertension)   . PAF (paroxysmal atrial fibrillation) ([place])    a. on Eliquis following CVA in 10/2015.    Past Surgical History:  Procedure Laterality Date  . EYE SURGERY     As child for muscle issue  . EYE SURGERY     Cosmetic for drooping eyelid    Current Medications: Outpatient Medications Prior to Visit  Medication Sig Dispense Refill  . amLODipine (NORVASC) 10 MG tablet Take 1 tablet (10 mg total) by mouth daily. 90 tablet 3  . apixaban (ELIQUIS) 5 MG TABS tablet Take 1 tablet (5 mg total) by mouth 2 (two) times daily. 60 tablet 6  . atorvastatin (LIPITOR) 80 MG tablet Take 1 tablet (80 mg total) by mouth daily at 6 PM. 30 tablet 6  . hydrochlorothiazide (MICROZIDE) 12.5 MG capsule Take 12.5 mg by mouth daily.    Marland Kitchen lisinopril (PRINIVIL,ZESTRIL) 40 MG tablet Take 1 tablet (40 mg total) by mouth daily. 90 tablet 3  . metoprolol tartrate (LOPRESSOR) 25 MG tablet Take 1 tablet (25 mg total) by mouth 2 (two) times daily. 60 tablet 6   No facility-administered medications prior to visit.      Allergies:   Patient has no known allergies.   Social History   Social History  . Marital status: Married    Spouse name: N/A  . Number of children: N/A  . Years of education: N/A  Social History Main Topics  . Smoking status: Never Smoker  . Smokeless tobacco: Never Used  . Alcohol use No  . Drug use: Unknown  . Sexual activity: Yes    Birth control/ protection: None   Other Topics Concern  . Not on file   Social History Narrative  . No narrative on file     Family History:  The patient's ***family history includes Alcoholism in his father; COPD in his father; Heart attack in his maternal uncle; Throat cancer in his brother; Transient ischemic attack in his mother.   ROS:   Please see the history of present illness.    ROS All other systems reviewed and are negative.   PHYSICAL EXAM:   VS:  There were no vitals taken for this visit.   GEN: Well nourished,  well developed, in no acute distress  HEENT: normal  Neck: no JVD, carotid bruits, or masses Cardiac: ***RRR; no murmurs, rubs, or gallops,no edema  Respiratory:  clear to auscultation bilaterally, normal work of breathing GI: soft, nontender, nondistended, + BS MS: no deformity or atrophy  Skin: warm and dry, no rash Neuro:  Alert and Oriented x 3, Strength and sensation are intact Psych: euthymic mood, full affect  Wt Readings from Last 3 Encounters:  01/02/16 143 lb (64.9 kg)  12/16/15 146 lb (66.2 kg)  12/01/15 147 lb (66.7 kg)      Studies/Labs Reviewed:   EKG:  EKG is*** ordered today.  The ekg ordered today demonstrates ***  Recent Labs: 11/17/2015: Hemoglobin 15.5; Magnesium 2.4; Platelets 169 11/25/2015: TSH 3.55 12/24/2015: ALT 40; BUN 18; Creat 0.96; Potassium 4.5; Sodium 141   Lipid Panel    Component Value Date/Time   CHOL 98 12/24/2015 1156   TRIG 51 12/24/2015 1156   HDL 44 12/24/2015 1156   CHOLHDL 2.2 12/24/2015 1156   VLDL 10 12/24/2015 1156   LDLCALC 44 12/24/2015 1156    Additional studies/ records that were reviewed today include:  ***    ASSESSMENT:    No diagnosis found.   PLAN:  In order of problems listed above:  1. ***    Medication Adjustments/Labs and Tests Ordered: Current medicines are reviewed at length with the patient today.  Concerns regarding medicines are outlined above.  Medication changes, Labs and Tests ordered today are listed in the Patient Instructions below. There are no Patient Instructions on file for this visit.   Hilbert Corrigan, [place]  02/02/2016 6:15 AM    [address], [place], [place]  [address] Phone: [phone]; Fax: [phone]

## 2016-02-03 ENCOUNTER — Other Ambulatory Visit: Payer: Self-pay | Admitting: Family Medicine

## 2016-02-03 DIAGNOSIS — I10 Essential (primary) hypertension: Secondary | ICD-10-CM

## 2016-02-04 ENCOUNTER — Encounter: Payer: Self-pay | Admitting: Internal Medicine

## 2016-02-04 NOTE — Progress Notes (Signed)
Message from Dr. Caryl Comes he had contacted the Eliquis rep directly to obtain samples for this patient.   Eliquis 5 mg samples given to the patient today. Lot #: [id number] Exp: 04/2018  # 3 boxes given.

## 2016-02-05 NOTE — Progress Notes (Signed)
This encounter was created in error - please disregard.

## 2016-02-12 ENCOUNTER — Ambulatory Visit
Admission: RE | Admit: 2016-02-12 | Discharge: 2016-02-12 | Disposition: A | Discharge status: Home / Self Care | Payer: Medicare Other | Source: Ambulatory Visit | Attending: Family Medicine | Admitting: Family Medicine

## 2016-02-12 ENCOUNTER — Other Ambulatory Visit: Payer: Self-pay | Admitting: Family Medicine

## 2016-02-12 DIAGNOSIS — I10 Essential (primary) hypertension: Secondary | ICD-10-CM

## 2016-02-12 DIAGNOSIS — R232 Flushing: Secondary | ICD-10-CM

## 2016-02-12 DIAGNOSIS — K5730 Diverticulosis of large intestine without perforation or abscess without bleeding: Secondary | ICD-10-CM | POA: Diagnosis not present

## 2016-02-20 DIAGNOSIS — Z79899 Other long term (current) drug therapy: Secondary | ICD-10-CM | POA: Diagnosis not present

## 2016-02-20 DIAGNOSIS — E785 Hyperlipidemia, unspecified: Secondary | ICD-10-CM | POA: Diagnosis not present

## 2016-02-21 LAB — COMPREHENSIVE METABOLIC PANEL
ALT: 55 U/L — ABNORMAL HIGH (ref 9–46)
AST: 32 U/L (ref 10–35)
Albumin: 4.4 g/dL (ref 3.6–5.1)
Alkaline Phosphatase: 73 U/L (ref 40–115)
BUN: 19 mg/dL (ref 7–25)
CO2: 27 mmol/L (ref 20–31)
Calcium: 9.3 mg/dL (ref 8.6–10.3)
Chloride: 103 mmol/L (ref 98–110)
Creat: 0.96 mg/dL (ref 0.70–1.25)
Glucose, Bld: 90 mg/dL (ref 65–99)
Potassium: 4.4 mmol/L (ref 3.5–5.3)
Sodium: 139 mmol/L (ref 135–146)
Total Bilirubin: 0.8 mg/dL (ref 0.2–1.2)
Total Protein: 7.1 g/dL (ref 6.1–8.1)

## 2016-02-21 LAB — LIPID PANEL
Cholesterol: 105 mg/dL (ref <200)
HDL: 56 mg/dL (ref >40)
LDL Cholesterol: 39 mg/dL (ref <100)
Total CHOL/HDL Ratio: 1.9 Ratio (ref <5.0)
Triglycerides: 50 mg/dL (ref <150)
VLDL: 10 mg/dL (ref <30)

## 2016-02-25 ENCOUNTER — Other Ambulatory Visit: Payer: Self-pay | Admitting: *Deleted

## 2016-02-25 MED ORDER — ATORVASTATIN CALCIUM 40 MG PO TABS: 40.0000 mg | ORAL_TABLET | Freq: Every day | ORAL | 5 refills | Status: DC

## 2016-02-28 NOTE — Progress Notes (Signed)
Cardiology Office Note   Date:  03/01/2016   ID:  Christian Jennings, DOB 1951/02/03, MRN [id number]  PCP:  London Pepper, MD  Cardiologist:   Minus Breeding, MD    Chief Complaint  Patient presents with  . Palpitations    History of Present Illness: Christian Jennings is a 66 y.o. male who presents for follow up of HTN and atrial fib.  He saw Christian Jennings recently for palpitations.  He thought that this was likely related to PVCs.  When I saw him in October he didn't really have any palpitations with his atrial fibrillation. He was very hyperlipidemic with a very elevated LDL. Blood pressure was difficult to control. He's been on multiple medications. Very impressively he's turned to some holistic therapies including lifestyle changes to include exercise and treatment for anxiety with breathing exercises and the like. He's had a tremendous response with excellent blood pressure control recently. His LDL is now 30s. We actually backed off on his Lipitor. He feels much better. The patient denies any new symptoms such as chest discomfort, neck or arm discomfort. There has been no new shortness of breath, PND or orthopnea. There have been no reported palpitations, presyncope or syncope.  Past Medical History:  Diagnosis Date  . CVA (cerebral vascular accident) ([place])    a. 10/2015 occurring in the setting of new-onset untreated atrial fibrillation. Started on Eliquis.  Marland Kitchen HLD (hyperlipidemia)   . HTN (hypertension)   . PAF (paroxysmal atrial fibrillation) ([place])    a. on Eliquis following CVA in 10/2015.    Past Surgical History:  Procedure Laterality Date  . EYE SURGERY     As child for muscle issue  . EYE SURGERY     Cosmetic for drooping eyelid     Current Outpatient Prescriptions  Medication Sig Dispense Refill  . amLODipine (NORVASC) 5 MG tablet Take 5 mg by mouth daily.    Marland Kitchen apixaban (ELIQUIS) 5 MG TABS tablet Take 1 tablet (5 mg total) by mouth 2 (two) times daily. 60 tablet 6  .  atorvastatin (LIPITOR) 40 MG tablet Take 1 tablet (40 mg total) by mouth daily at 6 PM. 30 tablet 5  . hydrochlorothiazide (MICROZIDE) 12.5 MG capsule Take 12.5 mg by mouth daily.    Marland Kitchen lisinopril (PRINIVIL,ZESTRIL) 40 MG tablet Take 1 tablet (40 mg total) by mouth daily. 90 tablet 3  . metoprolol tartrate (LOPRESSOR) 25 MG tablet Take 1 tablet (25 mg total) by mouth 2 (two) times daily. 60 tablet 6   No current facility-administered medications for this visit.     Allergies:   Patient has no known allergies.   ROS:  Please see the history of present illness.   Otherwise, review of systems are positive for none.   All other systems are reviewed and negative.    PHYSICAL EXAM: VS:  BP (!) 149/86   Pulse 83   Ht 5\' 4"  (1.626 m)   Wt 143 lb 6.4 oz (65 kg)   BMI 24.61 kg/m  , BMI Body mass index is 24.61 kg/m. GENERAL:  Well appearing NECK:  No jugular venous distention, waveform within normal limits, carotid upstroke brisk and symmetric, no bruits, no thyromegaly LYMPHATICS:  No cervical, inguinal adenopathy LUNGS:  Clear to auscultation bilaterally BACK:  No CVA tenderness CHEST:  Unremarkable HEART:  PMI not displaced or sustained,S1 and S2 within normal limits, no S3, no S4, no clicks, no rubs, no murmurs ABD:  Flat, positive bowel sounds  normal in frequency in pitch, no bruits, no rebound, no guarding, no midline pulsatile mass, no hepatomegaly, no splenomegaly EXT:  2 plus pulses throughout, no edema, no cyanosis no clubbing   EKG:  EKG is not ordered today.    Recent Labs: 11/17/2015: Hemoglobin 15.5; Magnesium 2.4; Platelets 169 11/25/2015: TSH 3.55 02/20/2016: ALT 55; BUN 19; Creat 0.96; Potassium 4.4; Sodium 139    Lipid Panel    Component Value Date/Time   CHOL 105 02/20/2016 1013   TRIG 50 02/20/2016 1013   HDL 56 02/20/2016 1013   CHOLHDL 1.9 02/20/2016 1013   VLDL 10 02/20/2016 1013   LDLCALC 39 02/20/2016 1013      Wt Readings from Last 3 Encounters:    03/01/16 143 lb 6.4 oz (65 kg)  01/02/16 143 lb (64.9 kg)  12/16/15 146 lb (66.2 kg)      Other studies Reviewed: Additional studies/ records that were reviewed today include: La tolerates anticoagulation. No change in therapy is indicated.bs. Review of the above records demonstrates:  Please see elsewhere in the note.     ASSESSMENT AND PLAN:  ATRIAL FIB:   He . Christian Jennings has a CHA2DS2 - VASc score of 4 with a risk of stroke of 4%.  He's had no further palpitations. No change in therapy is indicated.  HTN:  He did not have a follow-up CTA that was planned. He says he did have his 24-hour urine that was unremarkable. His blood pressures now very well controlled with therapeutic lifestyle changes and we might give her back off in the future worse on his ACE inhibitor and maybe later on his calcium channel blocker. I congratulated his efforts and encourage more of the same.  DYSLIPIDEMIA:  He's had a tremendous response to medications and lifestyle changes and he will remain on the meds as listed.  Current medicines are reviewed at length with the patient today.  The patient does not have concerns regarding medicines.  The following changes have been made:  no change  Labs/ tests ordered today include: None No orders of the defined types were placed in this encounter.   Disposition:   FU with me in six months.     Signed, Minus Breeding, MD  03/01/2016 3:21 PM    [facility]

## 2016-03-01 ENCOUNTER — Ambulatory Visit (INDEPENDENT_AMBULATORY_CARE_PROVIDER_SITE_OTHER): Payer: Medicare Other | Admitting: Cardiology

## 2016-03-01 ENCOUNTER — Encounter: Payer: Self-pay | Admitting: Cardiology

## 2016-03-01 VITALS — BP 149/86 | HR 83 | Ht 64.0 in | Wt 143.4 lb

## 2016-03-01 DIAGNOSIS — I480 Paroxysmal atrial fibrillation: Secondary | ICD-10-CM | POA: Diagnosis not present

## 2016-03-01 DIAGNOSIS — I10 Essential (primary) hypertension: Secondary | ICD-10-CM

## 2016-03-01 NOTE — Patient Instructions (Signed)

## 2016-03-10 DIAGNOSIS — I639 Cerebral infarction, unspecified: Secondary | ICD-10-CM | POA: Diagnosis not present

## 2016-03-10 DIAGNOSIS — Z0000 Encounter for general adult medical examination without abnormal findings: Secondary | ICD-10-CM | POA: Diagnosis not present

## 2016-03-10 DIAGNOSIS — Z23 Encounter for immunization: Secondary | ICD-10-CM | POA: Diagnosis not present

## 2016-03-10 DIAGNOSIS — E785 Hyperlipidemia, unspecified: Secondary | ICD-10-CM | POA: Diagnosis not present

## 2016-03-10 DIAGNOSIS — I10 Essential (primary) hypertension: Secondary | ICD-10-CM | POA: Diagnosis not present

## 2016-03-10 DIAGNOSIS — I4891 Unspecified atrial fibrillation: Secondary | ICD-10-CM | POA: Diagnosis not present

## 2016-03-10 DIAGNOSIS — I701 Atherosclerosis of renal artery: Secondary | ICD-10-CM | POA: Diagnosis not present

## 2016-03-10 DIAGNOSIS — Z1211 Encounter for screening for malignant neoplasm of colon: Secondary | ICD-10-CM | POA: Diagnosis not present

## 2016-03-18 ENCOUNTER — Ambulatory Visit (INDEPENDENT_AMBULATORY_CARE_PROVIDER_SITE_OTHER): Payer: Medicare Other | Admitting: Nurse Practitioner

## 2016-03-18 ENCOUNTER — Encounter: Payer: Self-pay | Admitting: Nurse Practitioner

## 2016-03-18 VITALS — BP 146/77 | HR 82 | Ht 64.5 in | Wt 147.8 lb

## 2016-03-18 DIAGNOSIS — I10 Essential (primary) hypertension: Secondary | ICD-10-CM | POA: Diagnosis not present

## 2016-03-18 DIAGNOSIS — I480 Paroxysmal atrial fibrillation: Secondary | ICD-10-CM | POA: Diagnosis not present

## 2016-03-18 DIAGNOSIS — Z8673 Personal history of transient ischemic attack (TIA), and cerebral infarction without residual deficits: Secondary | ICD-10-CM | POA: Diagnosis not present

## 2016-03-18 DIAGNOSIS — E785 Hyperlipidemia, unspecified: Secondary | ICD-10-CM | POA: Diagnosis not present

## 2016-03-18 NOTE — Progress Notes (Addendum)
[facility]  PATIENT: Christian Jennings DOB: 07-20-1950   REASON FOR VISIT:  follow-up for stroke , with risk factors of hypertension hyperlipidemia and atrial fibrillation and age HISTORY FROM: Patient     HISTORY OF PRESENT ILLNESS:UPDATE 02/01/2018[name], 66 year old male returns for follow-up with history of embolic stroke secondary to newly diagnosed atrial fibrillation. He is currently on eliquis was no bruising and bleeding and no further stroke or TIA symptoms. He remains on Lipitor without complaints of myalgias. Blood pressure 146/77 in the office today. He has had some adjustments to his blood pressure medication since last seen with increasing his Norvasc and HCTZ. He is also on lisinopril and Lopressor. He is becoming more active walking 40 minutes 6 days a week and/or playing golf with plans to start playing tennis. He returns for reevaluation  HISTORY10/31/17 CMErnest E Jennings a 66 y.o.malehistory of left-sided weakness that was  present for 4 days. He states that he was playing tennis, and went to use his left hand to throw up a serve and found that it wasn't working quite right. He then noticed over the course of the past few days that things still seemed to be not quite right including  his left face isn't quite normal. He therefore presented to [facility] today. While at [facility] [place], he was found to be in atrial fibrillation with rapid ventricular response, and appears to have flipped in and out. He does not notice his heart racing or any other symptoms with this, so it is unclear how long he has had it. MRI Right BG/CR infarcts - embolic secondary to  newly diagnosed atrial fibrillation. MRA - left MCA stenosis, bilateral ICA siphon and bilateral PCAs and left VA atherosclerosis Carotid Doppler - 1-39% ICA plaquing. Vertebral artery flow is antegrade. 2D Echo - EF 65-70%. No cardiac source of emboli identified.LDL - 216. He  returns today for hospital follow-up. He is currently on Eliquis for secondary stroke prevention and atrial fibrillation. He is on  Lipitor 80 mg daily. He has had some problems with fluctuations in his blood pressure and his physical therapy is on hold at present. His lisinopril is slowly being increased by cardiology. Blood pressure in the office 184/107. Christian Jennings feels that his left sided weakness has improved . His  speech Is without dysarthria. He continues to have mild left facial weakness. He has not had further stroke or TIA symptoms he returns to the clinic for reevaluation  REVIEW OF SYSTEMS: Full 14 system review of systems performed and notable only for those listed, all others are neg:  Constitutional: neg  Cardiovascular: neg Ear/Nose/Throat: neg  Skin: neg Eyes: neg Respiratory: neg Gastroitestinal: neg  Hematology/Lymphatic: neg  Endocrine: neg Musculoskeletal:neg Allergy/Immunology: neg Neurological: Mild weakness Psychiatric: neg Sleep : neg   ALLERGIES: No Known Allergies  HOME MEDICATIONS: Outpatient Medications Prior to Visit  Medication Sig Dispense Refill  . apixaban (ELIQUIS) 5 MG TABS tablet Take 1 tablet (5 mg total) by mouth 2 (two) times daily. 60 tablet 6  . atorvastatin (LIPITOR) 40 MG tablet Take 1 tablet (40 mg total) by mouth daily at 6 PM. 30 tablet 5  . lisinopril (PRINIVIL,ZESTRIL) 40 MG tablet Take 1 tablet (40 mg total) by mouth daily. 90 tablet 3  . metoprolol tartrate (LOPRESSOR) 25 MG tablet Take 1 tablet (25 mg total) by mouth 2 (two) times daily. 60 tablet 6  . amLODipine (NORVASC) 5 MG tablet Take 5  mg by mouth daily.    . hydrochlorothiazide (MICROZIDE) 12.5 MG capsule Take 12.5 mg by mouth daily.     No facility-administered medications prior to visit.     PAST MEDICAL HISTORY: Past Medical History:  Diagnosis Date  . CVA (cerebral vascular accident) ([place])    a. 10/2015 occurring in the setting of new-onset untreated atrial  fibrillation. Started on Eliquis.  Marland Kitchen HLD (hyperlipidemia)   . HTN (hypertension)   . PAF (paroxysmal atrial fibrillation) ([place])    a. on Eliquis following CVA in 10/2015.    PAST SURGICAL HISTORY: Past Surgical History:  Procedure Laterality Date  . EYE SURGERY     As child for muscle issue  . EYE SURGERY     Cosmetic for drooping eyelid    FAMILY HISTORY: Family History  Problem Relation Age of Onset  . Transient ischemic attack Mother   . Alcoholism Father   . COPD Father   . Throat cancer Brother     Or esophageal  . Heart attack Maternal Uncle     SOCIAL HISTORY: Social History   Social History  . Marital status: Married    Spouse name: N/A  . Number of children: N/A  . Years of education: N/A   Occupational History  . Not on file.   Social History Main Topics  . Smoking status: Never Smoker  . Smokeless tobacco: Never Used  . Alcohol use No  . Drug use: Unknown  . Sexual activity: Yes    Birth control/ protection: None   Other Topics Concern  . Not on file   Social History Narrative  . No narrative on file     PHYSICAL EXAM  Vitals:   03/18/16 1458  BP: (!) 146/77  Pulse: 82  Weight: 147 lb 12.8 oz (67 kg)  Height: 5' 4.5" (1.638 m)   Body mass index is 24.98 kg/m.  Generalized: Well developed, in no acute distress  Head: normocephalic and atraumatic,. Oropharynx benign  Neck: Supple, no carotid bruits  Cardiac: Regular rate rhythm, no murmur  Musculoskeletal: No deformity   Neurological examination   Mentation: Alert oriented to time, place, history taking. Attention span and concentration appropriate. Recent and remote memory intact.  Follows all commands speech and language fluent. No dysarthria  Cranial nerve II-XII: Fundoscopic exam reveals sharp disc margins.Pupils were equal round reactive to light extraocular movements were full, visual field were full on confrontational test. Facial strength normal. hearing was intact to  finger rubbing bilaterally. Uvula tongue midline. head turning and shoulder shrug were normal and symmetric.Tongue protrusion into cheek strength was normal. Motor: normal bulk and tone, full strength in the right upper and lower extremities. Mild left upper extremity proximal and distal weakness. Right lower extremity 5 out of 5.  Sensory: normal and symmetric to light touch, pinprick, and  Vibration, in the upper and lower extremities Coordination: finger-nose-finger, heel-to-shin bilaterally, no dysmetria. No tremor Reflexes: 1+ upper lower and symmetric, plantar responses were flexor bilaterally. Gait and Station: Rising up from seated position without assistance, wide based  stance,  moderate stride, good arm swing, smooth turning, able to perform tiptoe, and heel walking without difficulty. Tandem gait is steady.Marland Kitchen   DIAGNOSTIC DATA (LABS, IMAGING, TESTING) - I reviewed patient records, labs, notes, testing and imaging myself where available.  Lab Results  Component Value Date   WBC 8.5 11/17/2015   HGB 15.5 11/17/2015   HCT 46.1 11/17/2015   MCV 89.5 11/17/2015   PLT 169 11/17/2015  Component Value Date/Time   NA 139 02/20/2016 1013   K 4.4 02/20/2016 1013   CL 103 02/20/2016 1013   CO2 27 02/20/2016 1013   GLUCOSE 90 02/20/2016 1013   BUN 19 02/20/2016 1013   CREATININE 0.96 02/20/2016 1013   CALCIUM 9.3 02/20/2016 1013   PROT 7.1 02/20/2016 1013   ALBUMIN 4.4 02/20/2016 1013   AST 32 02/20/2016 1013   ALT 55 (H) 02/20/2016 1013   ALKPHOS 73 02/20/2016 1013   BILITOT 0.8 02/20/2016 1013   GFRNONAA >60 11/17/2015 0620   GFRAA >60 11/17/2015 0620   Lab Results  Component Value Date   CHOL 105 02/20/2016   HDL 56 02/20/2016   LDLCALC 39 02/20/2016   TRIG 50 02/20/2016   CHOLHDL 1.9 02/20/2016   Lab Results  Component Value Date   HGBA1C 5.2 11/17/2015   No results found for: VITAMINB12 Lab Results  Component Value Date   TSH 3.55 11/25/2015       ASSESSMENT AND PLAN  66 y.o. year old male  has a past medical history of CVA (cerebral vascular accident) ([place]); HLD (hyperlipidemia); HTN (hypertension); and PAF (paroxysmal atrial fibrillation) ([place]). here For  follow-up for stroke  The patient is a current patient of Dr. Erlinda Hong  who is out of the office today . This note is sent to the work in doctor.     PLAN: Stressed the importance of continued management of risk factors to prevent further stroke Continue Eliquis for secondary stroke prevention and atrial fibrillation Maintain strict control of hypertension with blood pressure goal below 130/90, today's reading 146/77 continue antihypertensive medications  Cholesterol with LDL cholesterol less than 70, continue Lipitor Exercise by walking, play golf or tennis stay active, eat healthy diet with whole grains,  fresh fruits and vegetables Follow-up in 6 months Discussed risk for recurrent stroke/ TIA and answered additional questions I spent 15 min in total face to face time with the patient more than 50% of which was spent counseling and coordination of care, reviewing test results reviewing medications and discussing and reviewing the diagnosis of stroke and risk factors. Answering all questions. Dennie Bible, [facility], [facility], APRN  [facility] [address], [place], [place] [address] [phone]   I reviewed the above note and documentation by the Nurse Practitioner and agree with the history, physical exam, assessment and plan as outlined above. I was immediately available for face-to-face consultation. Star Age, MD, PhD [facility])

## 2016-03-18 NOTE — Patient Instructions (Addendum)
Continue Eliquis for secondary stroke prevention and atrial fibrillation Maintain strict control of hypertension with blood pressure goal below 130/90, today's reading 146/77 continue antihypertensive medications  Cholesterol with LDL cholesterol less than 70, continue Lipitor Exercise by walking, play golf or tennis stay active, eat healthy diet with whole grains,  fresh fruits and vegetables Follow-up in 6 months

## 2016-04-05 DIAGNOSIS — Z1211 Encounter for screening for malignant neoplasm of colon: Secondary | ICD-10-CM | POA: Diagnosis not present

## 2016-04-12 DIAGNOSIS — Z23 Encounter for immunization: Secondary | ICD-10-CM | POA: Diagnosis not present

## 2016-04-12 DIAGNOSIS — I4891 Unspecified atrial fibrillation: Secondary | ICD-10-CM | POA: Diagnosis not present

## 2016-04-12 DIAGNOSIS — R6882 Decreased libido: Secondary | ICD-10-CM | POA: Diagnosis not present

## 2016-04-12 DIAGNOSIS — E785 Hyperlipidemia, unspecified: Secondary | ICD-10-CM | POA: Diagnosis not present

## 2016-04-21 DIAGNOSIS — F411 Generalized anxiety disorder: Secondary | ICD-10-CM | POA: Diagnosis not present

## 2016-04-21 DIAGNOSIS — G4700 Insomnia, unspecified: Secondary | ICD-10-CM | POA: Diagnosis not present

## 2016-04-29 ENCOUNTER — Encounter (HOSPITAL_COMMUNITY): Payer: Self-pay | Admitting: Emergency Medicine

## 2016-04-29 ENCOUNTER — Emergency Department (HOSPITAL_COMMUNITY)
Admission: EM | Admit: 2016-04-29 | Discharge: 2016-04-29 | Disposition: A | Discharge status: Home / Self Care | Payer: Medicare Other | Attending: Emergency Medicine | Admitting: Emergency Medicine

## 2016-04-29 DIAGNOSIS — F5101 Primary insomnia: Secondary | ICD-10-CM | POA: Diagnosis not present

## 2016-04-29 DIAGNOSIS — I10 Essential (primary) hypertension: Secondary | ICD-10-CM | POA: Diagnosis not present

## 2016-04-29 DIAGNOSIS — Z8673 Personal history of transient ischemic attack (TIA), and cerebral infarction without residual deficits: Secondary | ICD-10-CM | POA: Diagnosis not present

## 2016-04-29 DIAGNOSIS — Z79899 Other long term (current) drug therapy: Secondary | ICD-10-CM | POA: Insufficient documentation

## 2016-04-29 DIAGNOSIS — Z7901 Long term (current) use of anticoagulants: Secondary | ICD-10-CM | POA: Insufficient documentation

## 2016-04-29 DIAGNOSIS — F329 Major depressive disorder, single episode, unspecified: Secondary | ICD-10-CM | POA: Insufficient documentation

## 2016-04-29 NOTE — ED Notes (Signed)
ED Provider at bedside. 

## 2016-04-29 NOTE — Discharge Instructions (Signed)
I would consider stopping the Zoloft. Recommend contacting primary care doctor or pharmacist prior to doing that. Also recommend follow-up with your primary care doctor. May require psychiatric consultation. Return for any new or worse symptoms.

## 2016-04-29 NOTE — ED Triage Notes (Addendum)
Pt to ER with complaint of gradual worsening of depression and anxiety over the last month since a stroke 5 months ago. Pt reports stroke left him with left sided weakness. Reports decrease in appetite, nausea, and stomach pain. Also reports insomnia, regardless of taking ambien. Pt is pleasant, calm, cooperative. Wife at bedside. Denies SI/HI.

## 2016-04-29 NOTE — ED Provider Notes (Signed)
[facility] DEPT Provider Note   CSN: [id number] Arrival date & time: 04/29/16  5277     History   Chief Complaint Chief Complaint  Patient presents with  . Depression    HPI Christian Jennings is a 66 y.o. male.  Patient status post embolic stroke in the fall of 2017 with left-sided weakness with almost full recovery. Followed by [facility] neurology. Patient was noted to have atrial fibrillation and is now on blood thinners. Patient's been struggling with insomnia for several months. Not helped by Ambien or melatonin. Primary care Dr. recently started him on Zoloft. Patient started taking 50 mg of Zoloft once a day on Sunday. Since that time patient's had some increased bilateral leg weakness or stiffness nothing focal in nature. Also insomnia is not improved. Patient states he feels worse on the Zoloft. Patient denies any suicidal homicidal ideation. No other neurological concerns.      Past Medical History:  Diagnosis Date  . CVA (cerebral vascular accident) ([place])    a. 10/2015 occurring in the setting of new-onset untreated atrial fibrillation. Started on Eliquis.  Marland Kitchen HLD (hyperlipidemia)   . HTN (hypertension)   . PAF (paroxysmal atrial fibrillation) ([place])    a. on Eliquis following CVA in 10/2015.    Patient Active Problem List   Diagnosis Date Noted  . Essential hypertension 01/29/2016  . Carotid arterial disease ([place]) 11/25/2015  . Elevated TSH 11/25/2015  . PAF (paroxysmal atrial fibrillation) ([place])   . Accelerated hypertension   . History of CVA (cerebrovascular accident)   . Hyperlipidemia   . Atrial fibrillation with rapid ventricular response ([place]) 11/14/2015    Past Surgical History:  Procedure Laterality Date  . EYE SURGERY     As child for muscle issue  . EYE SURGERY     Cosmetic for drooping eyelid       Home Medications    Prior to Admission medications   Medication Sig Start Date End Date Taking? Authorizing Provider  amLODipine (NORVASC)  10 MG tablet Take 10 mg by mouth daily.  02/23/16   Historical Provider, MD  apixaban (ELIQUIS) 5 MG TABS tablet Take 1 tablet (5 mg total) by mouth 2 (two) times daily. 12/01/15   Erma Heritage, PA  atorvastatin (LIPITOR) 40 MG tablet Take 1 tablet (40 mg total) by mouth daily at 6 PM. 02/25/16   Erlene Quan, PA-C  hydrochlorothiazide (HYDRODIURIL) 25 MG tablet 1 tablet daily 12/24/15   Historical Provider, MD  lisinopril (PRINIVIL,ZESTRIL) 40 MG tablet Take 1 tablet (40 mg total) by mouth daily. 01/02/16   Erlene Quan, PA-C  metoprolol tartrate (LOPRESSOR) 25 MG tablet Take 1 tablet (25 mg total) by mouth 2 (two) times daily. 12/01/15   Erma Heritage, PA    Family History Family History  Problem Relation Age of Onset  . Transient ischemic attack Mother   . Alcoholism Father   . COPD Father   . Throat cancer Brother     Or esophageal  . Heart attack Maternal Uncle     Social History Social History  Substance Use Topics  . Smoking status: Never Smoker  . Smokeless tobacco: Never Used  . Alcohol use No     Allergies   Patient has no known allergies.   Review of Systems Review of Systems  Constitutional: Positive for fatigue. Negative for fever.  HENT: Negative for congestion.   Eyes: Negative for visual disturbance.  Respiratory: Negative for shortness of breath.   Cardiovascular:  Negative for chest pain.  Gastrointestinal: Positive for nausea.  Genitourinary: Negative for dysuria.  Musculoskeletal: Positive for gait problem.  Skin: Negative for rash.  Neurological: Positive for weakness. Negative for headaches.  Hematological: Bruises/bleeds easily.  Psychiatric/Behavioral: Negative for confusion.     Physical Exam Updated Vital Signs BP 158/83 (BP Location: Right Arm)   Pulse 62   Temp 98 F (36.7 C) (Oral)   Resp 16   SpO2 100%   Physical Exam  Constitutional: He is oriented to person, place, and time. He appears well-developed and  well-nourished. No distress.  HENT:  Head: Normocephalic and atraumatic.  Mouth/Throat: Oropharynx is clear and moist.  Eyes: EOM are normal. Pupils are equal, round, and reactive to light.  Neck: Normal range of motion.  Cardiovascular: Normal rate, regular rhythm and normal heart sounds.   Pulmonary/Chest: Effort normal and breath sounds normal.  Abdominal: Soft. Bowel sounds are normal.  Musculoskeletal: Normal range of motion. He exhibits no edema.  Neurological: He is alert and oriented to person, place, and time. No cranial nerve deficit or sensory deficit. He exhibits normal muscle tone. Coordination normal.  Except for very slight left-sided weakness.  Skin: Skin is warm. No rash noted.  Nursing note and vitals reviewed.    ED Treatments / Results  Labs (all labs ordered are listed, but only abnormal results are displayed) Labs Reviewed - No data to display  EKG  EKG Interpretation None       Radiology No results found.  Procedures Procedures (including critical care time)  Medications Ordered in ED Medications - No data to display   Initial Impression / Assessment and Plan / ED Course  I have reviewed the triage vital signs and the nursing notes.  Pertinent labs & imaging results that were available during my care of the patient were reviewed by me and considered in my medical decision making (see chart for details).    Patient will need to follow-up with primary care doctor. Zoloft should not be stopped abruptly with patient is on a very low dose. Will have him contact his primary care doctor seeking stop it and/or contact pharmacist. Symptoms could be related to Zoloft. However does not get to the underlying symptoms of the insomnia that's been going on for months. Patient without any evidence of new or acute stroke today. Patient is not suicidal or homicidal. Patient may also require close consultation with his primary care doctor for neurology as well as  perhaps a new consult to psychiatry.   Final Clinical Impressions(s) / ED Diagnoses   Final diagnoses:  None    New Prescriptions New Prescriptions   No medications on file     Fredia Sorrow, MD 04/29/16 [phone]

## 2016-05-07 DIAGNOSIS — E871 Hypo-osmolality and hyponatremia: Secondary | ICD-10-CM | POA: Diagnosis not present

## 2016-05-26 DIAGNOSIS — I10 Essential (primary) hypertension: Secondary | ICD-10-CM | POA: Diagnosis not present

## 2016-05-26 DIAGNOSIS — G4700 Insomnia, unspecified: Secondary | ICD-10-CM | POA: Diagnosis not present

## 2016-05-26 DIAGNOSIS — R110 Nausea: Secondary | ICD-10-CM | POA: Diagnosis not present

## 2016-05-26 DIAGNOSIS — W19XXXA Unspecified fall, initial encounter: Secondary | ICD-10-CM | POA: Diagnosis not present

## 2016-05-26 DIAGNOSIS — M79606 Pain in leg, unspecified: Secondary | ICD-10-CM | POA: Diagnosis not present

## 2016-05-26 DIAGNOSIS — F329 Major depressive disorder, single episode, unspecified: Secondary | ICD-10-CM | POA: Diagnosis not present

## 2016-06-11 DIAGNOSIS — R634 Abnormal weight loss: Secondary | ICD-10-CM | POA: Diagnosis not present

## 2016-06-11 DIAGNOSIS — I10 Essential (primary) hypertension: Secondary | ICD-10-CM | POA: Diagnosis not present

## 2016-06-11 DIAGNOSIS — B370 Candidal stomatitis: Secondary | ICD-10-CM | POA: Diagnosis not present

## 2016-06-11 DIAGNOSIS — R140 Abdominal distension (gaseous): Secondary | ICD-10-CM | POA: Diagnosis not present

## 2016-06-11 DIAGNOSIS — G4700 Insomnia, unspecified: Secondary | ICD-10-CM | POA: Diagnosis not present

## 2016-06-25 DIAGNOSIS — G4700 Insomnia, unspecified: Secondary | ICD-10-CM | POA: Diagnosis not present

## 2016-06-25 DIAGNOSIS — F411 Generalized anxiety disorder: Secondary | ICD-10-CM | POA: Diagnosis not present

## 2016-06-25 DIAGNOSIS — I10 Essential (primary) hypertension: Secondary | ICD-10-CM | POA: Diagnosis not present

## 2016-07-15 DIAGNOSIS — R6889 Other general symptoms and signs: Secondary | ICD-10-CM | POA: Diagnosis not present

## 2016-07-20 ENCOUNTER — Other Ambulatory Visit: Payer: Self-pay | Admitting: Student

## 2016-08-02 DIAGNOSIS — R1011 Right upper quadrant pain: Secondary | ICD-10-CM | POA: Diagnosis not present

## 2016-08-02 DIAGNOSIS — R634 Abnormal weight loss: Secondary | ICD-10-CM | POA: Diagnosis not present

## 2016-08-04 ENCOUNTER — Telehealth: Payer: Self-pay | Admitting: *Deleted

## 2016-08-04 NOTE — Telephone Encounter (Signed)
Requesting surgical clearance:   1. Type of surgery: Colonoscopy  2. Surgeon: Dr Michail Sermon  3. Surgical date: 10/11/2016  4. Medications that need to be help: Eliquis  5. [facility]: Mamie Nick) [phone]   Pt saw you on 03/01/2016, Is pt cleared for surgery and how long Eliquis can be held?

## 2016-08-06 NOTE — Telephone Encounter (Signed)
OK to hold Eliquis for two days prior to the procedure.  No bridging is necessary.

## 2016-08-06 NOTE — Telephone Encounter (Signed)
Clearance faxed to [facility] via Epic

## 2016-08-22 ENCOUNTER — Other Ambulatory Visit: Payer: Self-pay | Admitting: Student

## 2016-09-07 DIAGNOSIS — E785 Hyperlipidemia, unspecified: Secondary | ICD-10-CM | POA: Diagnosis not present

## 2016-09-07 DIAGNOSIS — I10 Essential (primary) hypertension: Secondary | ICD-10-CM | POA: Diagnosis not present

## 2016-09-07 DIAGNOSIS — I4891 Unspecified atrial fibrillation: Secondary | ICD-10-CM | POA: Diagnosis not present

## 2016-09-07 DIAGNOSIS — I639 Cerebral infarction, unspecified: Secondary | ICD-10-CM | POA: Diagnosis not present

## 2016-09-07 DIAGNOSIS — G4700 Insomnia, unspecified: Secondary | ICD-10-CM | POA: Diagnosis not present

## 2016-09-13 ENCOUNTER — Telehealth: Payer: Self-pay | Admitting: Nurse Practitioner

## 2016-09-13 NOTE — Telephone Encounter (Signed)
Patient called office requesting that I cancel his fu visit with Cecille Rubin, NP on August 1st per patient he is wanting to follow up with a Dr. Lavina Hamman.  Patient states if unable to see Dr. He will have to get his records.  Patient see Dr. Erlinda Hong in hospital in September 2017.  Please call

## 2016-09-13 NOTE — Telephone Encounter (Signed)
RN request Christian Jennings call patient back to r/s next available with Dr. Erlinda Hong. PT stated no one ever discuss his MRI results with him.Pt has seen NP 12/16/2015, and 03/2016 for office visits. PT has no phone calls documented he was never told results.

## 2016-09-13 NOTE — Telephone Encounter (Signed)
Pt had MRI in 10/2015 when he was admitted in hospital for stroke. Since then, we did not repeat MRI. He has appointment with me on 10/06/16 and I will show him the MRI images then. Please let the pt know. Thanks.   Rosalin Hawking, MD PhD Stroke Neurology 09/13/2016 10:44 PM

## 2016-09-15 ENCOUNTER — Ambulatory Visit: Payer: Medicare Other | Admitting: Nurse Practitioner

## 2016-10-06 ENCOUNTER — Ambulatory Visit: Payer: Medicare Other | Admitting: Neurology

## 2016-11-03 ENCOUNTER — Ambulatory Visit: Payer: Medicare Other | Admitting: Neurology

## 2016-12-13 DIAGNOSIS — G4700 Insomnia, unspecified: Secondary | ICD-10-CM | POA: Diagnosis not present

## 2016-12-13 DIAGNOSIS — I10 Essential (primary) hypertension: Secondary | ICD-10-CM | POA: Diagnosis not present

## 2016-12-13 DIAGNOSIS — I4891 Unspecified atrial fibrillation: Secondary | ICD-10-CM | POA: Diagnosis not present

## 2016-12-15 DIAGNOSIS — Z23 Encounter for immunization: Secondary | ICD-10-CM | POA: Diagnosis not present

## 2017-01-11 ENCOUNTER — Encounter: Payer: Self-pay | Admitting: Physical Therapy

## 2017-01-11 NOTE — Therapy (Signed)
[facility] [address] [place], [place], [address] Phone: [phone]   Fax:  [phone]  Patient Details  Name: Christian Jennings MRN: [id number] Date of Birth: 08/08/1950 Referring Provider:  No ref. provider found  Encounter Date: 01/11/2017  PHYSICAL THERAPY DISCHARGE SUMMARY  Visits from Start of Care: 4 (last visit 12/02/15)  Current functional level related to goals / functional outcomes: Not able to fully assess goals, as pt cancelled remaining appointments due to continued high BP.   Remaining deficits: See eval   Education / Equipment: Educated in CVA warning signs, HTN.  Plan: Patient agrees to discharge.  Patient goals were not met. Patient is being discharged due to the patient's request.  ?????Pt cancelled remaining visits on 01/13/16, after cancelling several appointments due to continued elevated BP.  Pt feels pleased with his current level of function at the time of requested discharge/  Mady Haagensen, PT 01/11/17 10:42 AM Phone: [phone] Fax: [place]. 01/11/2017, 10:40 AM  [facility] [address] [place], [place], [address] Phone: [phone]   Fax:  [phone]

## 2017-02-19 ENCOUNTER — Other Ambulatory Visit: Payer: Self-pay | Admitting: Cardiology

## 2017-02-24 ENCOUNTER — Other Ambulatory Visit: Payer: Self-pay | Admitting: Cardiology

## 2017-03-01 ENCOUNTER — Other Ambulatory Visit: Payer: Self-pay | Admitting: Cardiology

## 2017-03-15 DIAGNOSIS — I10 Essential (primary) hypertension: Secondary | ICD-10-CM | POA: Diagnosis not present

## 2017-03-15 DIAGNOSIS — I4891 Unspecified atrial fibrillation: Secondary | ICD-10-CM | POA: Diagnosis not present

## 2017-03-15 DIAGNOSIS — Z0000 Encounter for general adult medical examination without abnormal findings: Secondary | ICD-10-CM | POA: Diagnosis not present

## 2017-03-15 DIAGNOSIS — Z1211 Encounter for screening for malignant neoplasm of colon: Secondary | ICD-10-CM | POA: Diagnosis not present

## 2017-03-15 DIAGNOSIS — E785 Hyperlipidemia, unspecified: Secondary | ICD-10-CM | POA: Diagnosis not present

## 2017-03-15 DIAGNOSIS — R5383 Other fatigue: Secondary | ICD-10-CM | POA: Diagnosis not present

## 2017-04-19 DIAGNOSIS — Z23 Encounter for immunization: Secondary | ICD-10-CM | POA: Diagnosis not present

## 2017-08-17 DIAGNOSIS — D3131 Benign neoplasm of right choroid: Secondary | ICD-10-CM | POA: Diagnosis not present

## 2017-08-17 DIAGNOSIS — H35369 Drusen (degenerative) of macula, unspecified eye: Secondary | ICD-10-CM | POA: Diagnosis not present

## 2017-08-19 ENCOUNTER — Telehealth: Payer: Self-pay | Admitting: Cardiology

## 2017-08-19 ENCOUNTER — Other Ambulatory Visit: Payer: Self-pay | Admitting: Cardiology

## 2017-08-19 MED ORDER — APIXABAN 5 MG PO TABS: ORAL_TABLET | ORAL | 0 refills | Status: DC

## 2017-08-19 NOTE — Telephone Encounter (Signed)
New Message    *STAT* If patient is at the pharmacy, call can be transferred to refill team.   1. Which medications need to be refilled? (please list name of each medication and dose if known) ELIQUIS 5 MG TABS tablet  2. Which pharmacy/location (including street and city if local pharmacy) is medication to be sent to? [place], [place]  3. Do they need a 30 day or 90 day supply? 90 pt states he will be out of this medication on sunday

## 2017-08-19 NOTE — Telephone Encounter (Signed)
Pt was last seen 02-2016 and was to f/u 07-2016. pt needs to  F/u for future refills. Returned call to pt he states that he needs refill since he will be out of medication on Sunday. Informed pt that he is overdue for f/u appt. Rx has already been sent and pt notified. Appt scheduled for 8-21 since pt is out of town until then. Pt will fill rx. Pt would like an email sent to remind him of appt. eliqis samples at the front desk for pt to puck up. Mychart message sent to inform of appt and samples. Samples are at the front desk

## 2017-09-13 DIAGNOSIS — E785 Hyperlipidemia, unspecified: Secondary | ICD-10-CM | POA: Diagnosis not present

## 2017-09-13 DIAGNOSIS — R5383 Other fatigue: Secondary | ICD-10-CM | POA: Diagnosis not present

## 2017-09-13 DIAGNOSIS — I4891 Unspecified atrial fibrillation: Secondary | ICD-10-CM | POA: Diagnosis not present

## 2017-09-13 DIAGNOSIS — F411 Generalized anxiety disorder: Secondary | ICD-10-CM | POA: Diagnosis not present

## 2017-09-13 DIAGNOSIS — I10 Essential (primary) hypertension: Secondary | ICD-10-CM | POA: Diagnosis not present

## 2017-09-19 ENCOUNTER — Other Ambulatory Visit: Payer: Self-pay | Admitting: Cardiology

## 2017-10-04 NOTE — Progress Notes (Signed)
Cardiology Office Note   Date:  10/05/2017   ID:  TYJAE ISSA, DOB 1951-02-11, MRN [id number]  PCP:  London Pepper, MD  Cardiologist:   Minus Breeding, MD    Chief Complaint  Patient presents with  . Atrial Fibrillation    History of Present Illness: Christian Jennings is a 67 y.o. male who presents for follow up of HTN and atrial fib.  Since I last saw him he has done well.  The patient denies any new symptoms such as chest discomfort, neck or arm discomfort. There has been no new shortness of breath, PND or orthopnea. There have been no reported palpitations, presyncope or syncope.  He has some rare PVCs he believed.     Past Medical History:  Diagnosis Date  . CVA (cerebral vascular accident) ([place])    a. 10/2015 occurring in the setting of new-onset untreated atrial fibrillation. Started on Eliquis.  Marland Kitchen HLD (hyperlipidemia)   . HTN (hypertension)   . PAF (paroxysmal atrial fibrillation) ([place])    a. on Eliquis following CVA in 10/2015.    Past Surgical History:  Procedure Laterality Date  . EYE SURGERY     As child for muscle issue  . EYE SURGERY     Cosmetic for drooping eyelid     Current Outpatient Medications  Medication Sig Dispense Refill  . amLODipine (NORVASC) 10 MG tablet Take 10 mg by mouth daily.     Marland Kitchen apixaban (ELIQUIS) 5 MG TABS tablet Take 1 tablet (5 mg total) by mouth 2 (two) times daily. 60 tablet 0  . atorvastatin (LIPITOR) 20 MG tablet Take 20 mg by mouth daily.    . hydrochlorothiazide (HYDRODIURIL) 25 MG tablet 1 tablet daily    . lisinopril (PRINIVIL,ZESTRIL) 40 MG tablet TAKE ONE TABLET BY MOUTH ONCE DAILY 90 tablet 3  . sertraline (ZOLOFT) 50 MG tablet Take 50 mg by mouth daily.    Marland Kitchen zolpidem (AMBIEN) 10 MG tablet Take 5-10 mg by mouth at bedtime as needed for sleep.     No current facility-administered medications for this visit.     Allergies:   Patient has no known allergies.   ROS:  Please see the history of present illness.    Otherwise, review of systems are positive for none.   All other systems are reviewed and negative.    PHYSICAL EXAM: VS:  BP 132/78   Pulse 95   Ht 5' 4.5" (1.638 m)   Wt 139 lb (63 kg)   BMI 23.49 kg/m  , BMI Body mass index is 23.49 kg/m. GENERAL:  Well appearing NECK:  No jugular venous distention, waveform within normal limits, carotid upstroke brisk and symmetric, no bruits, no thyromegaly LUNGS:  Clear to auscultation bilaterally CHEST:  Unremarkable HEART:  PMI not displaced or sustained,S1 and S2 within normal limits, no S3, no S4, no clicks, no rubs, no murmurs ABD:  Flat, positive bowel sounds normal in frequency in pitch, no bruits, no rebound, no guarding, no midline pulsatile mass, no hepatomegaly, no splenomegaly EXT:  2 plus pulses throughout, no edema, no cyanosis no clubbing    EKG:  EKG is  ordered today. Sinus rhythm, rate 95, axis within normal limits, intervals within normal limits, no acute ST-T wave changes.   Recent Labs: No results found for requested labs within last 8760 hours.    Lipid Panel   Wt Readings from Last 3 Encounters:  10/05/17 139 lb (63 kg)  03/18/16 147 lb 12.8  oz (67 kg)  03/01/16 143 lb 6.4 oz (65 kg)      Other studies Reviewed: Additional studies/ records that were reviewed today include:  None Review of the above records demonstrates:  No   ASSESSMENT AND PLAN:  ATRIAL FIB:   He . [name] has a CHA2DS2 - VASc score of 4 with a risk of stroke of 4%.   No change in therapy.    HTN:     The blood pressure is at target. No change in medications is indicated. We will continue with therapeutic lifestyle changes (TLC).  DYSLIPIDEMIA:    He does not tolerate higher dose statin.  No change in therapy.     Current medicines are reviewed at length with the patient today.  The patient does not have concerns regarding medicines.  The following changes have been made:  None  Labs/ tests ordered today include:  None  Orders Placed This Encounter  Procedures  . EKG 12-Lead    Disposition:   FU with me in 12 months.     Signed, Minus Breeding, MD  10/05/2017 10:19 AM    [facility]

## 2017-10-05 ENCOUNTER — Ambulatory Visit (INDEPENDENT_AMBULATORY_CARE_PROVIDER_SITE_OTHER): Payer: Medicare Other | Admitting: Cardiology

## 2017-10-05 ENCOUNTER — Encounter: Payer: Self-pay | Admitting: Cardiology

## 2017-10-05 VITALS — BP 132/78 | HR 95 | Ht 64.5 in | Wt 139.0 lb

## 2017-10-05 DIAGNOSIS — I480 Paroxysmal atrial fibrillation: Secondary | ICD-10-CM

## 2017-10-05 DIAGNOSIS — I10 Essential (primary) hypertension: Secondary | ICD-10-CM

## 2017-10-05 DIAGNOSIS — E785 Hyperlipidemia, unspecified: Secondary | ICD-10-CM | POA: Diagnosis not present

## 2017-10-05 NOTE — Patient Instructions (Signed)

## 2017-10-20 ENCOUNTER — Other Ambulatory Visit: Payer: Self-pay | Admitting: Cardiology

## 2017-12-16 DIAGNOSIS — Z8673 Personal history of transient ischemic attack (TIA), and cerebral infarction without residual deficits: Secondary | ICD-10-CM | POA: Diagnosis not present

## 2017-12-16 DIAGNOSIS — Z1331 Encounter for screening for depression: Secondary | ICD-10-CM | POA: Diagnosis not present

## 2018-03-01 ENCOUNTER — Other Ambulatory Visit: Payer: Self-pay | Admitting: Cardiology

## 2018-03-01 NOTE — Telephone Encounter (Signed)
Rx request sent to pharmacy.  

## 2018-03-02 ENCOUNTER — Other Ambulatory Visit: Payer: Self-pay | Admitting: Cardiology

## 2018-03-21 DIAGNOSIS — E785 Hyperlipidemia, unspecified: Secondary | ICD-10-CM | POA: Diagnosis not present

## 2018-03-21 DIAGNOSIS — G4700 Insomnia, unspecified: Secondary | ICD-10-CM | POA: Diagnosis not present

## 2018-03-21 DIAGNOSIS — I10 Essential (primary) hypertension: Secondary | ICD-10-CM | POA: Diagnosis not present

## 2018-03-21 DIAGNOSIS — F418 Other specified anxiety disorders: Secondary | ICD-10-CM | POA: Diagnosis not present

## 2018-03-21 DIAGNOSIS — I4891 Unspecified atrial fibrillation: Secondary | ICD-10-CM | POA: Diagnosis not present

## 2018-03-21 DIAGNOSIS — Z0000 Encounter for general adult medical examination without abnormal findings: Secondary | ICD-10-CM | POA: Diagnosis not present

## 2018-03-21 DIAGNOSIS — Z1211 Encounter for screening for malignant neoplasm of colon: Secondary | ICD-10-CM | POA: Diagnosis not present

## 2018-04-15 ENCOUNTER — Other Ambulatory Visit: Payer: Self-pay | Admitting: Cardiology

## 2018-07-16 ENCOUNTER — Other Ambulatory Visit: Payer: Self-pay | Admitting: Cardiology

## 2018-08-20 IMAGING — CT CT ABDOMEN W/O CM
2 of 4 series · 12 of 36 positions shown, 19 images · IV contrast (READICAT/WATER)
Comparison: None.

CLINICAL DATA: [place] of the head neck and face for several
months, evaluate for adrenal lesion

EXAM:
CT ABDOMEN WITHOUT CONTRAST
TECHNIQUE: Multidetector CT imaging of the abdomen was performed following the
standard protocol without IV contrast.

[Series 3: adrenal (id) · axial · 0.68mm/px · z∈[-254,-30]mm · 11 of 108 slices shown, 17 images]
[im 9/108  soft-tissue]
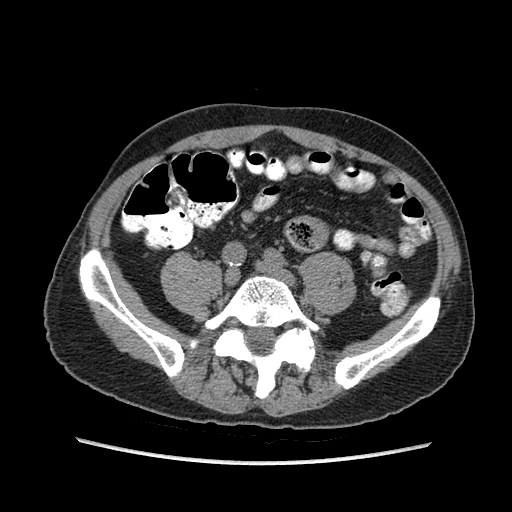
[im 9/108  bone]
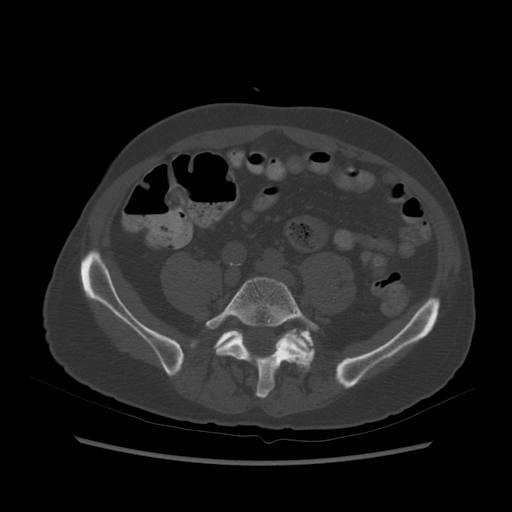
[im 18/108  soft-tissue]
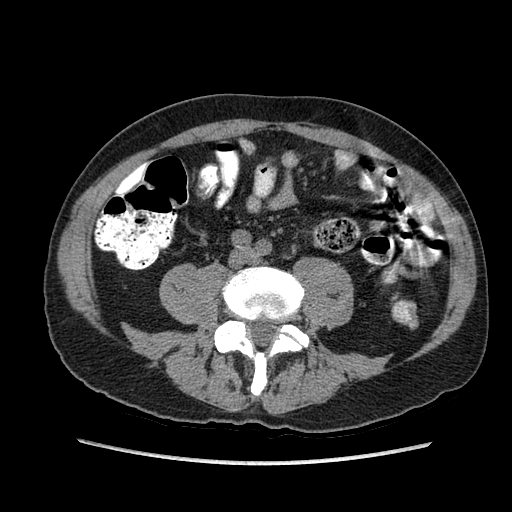
[im 27/108  soft-tissue]
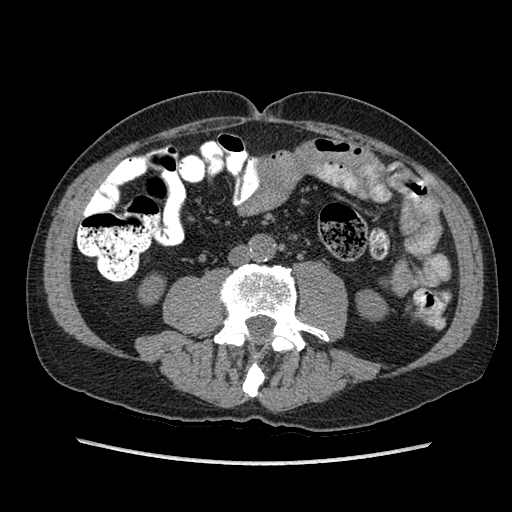
[im 36/108  soft-tissue]
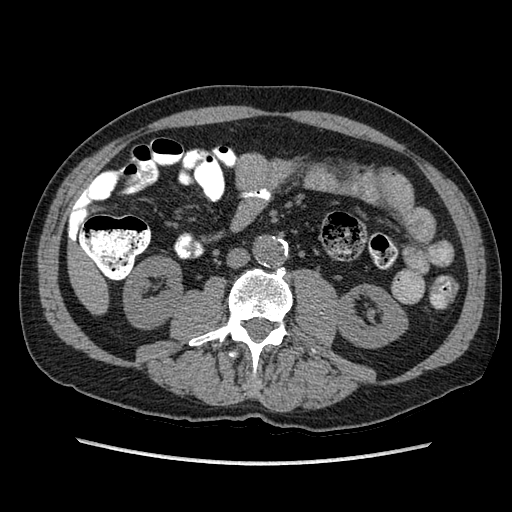
[im 45/108  soft-tissue]
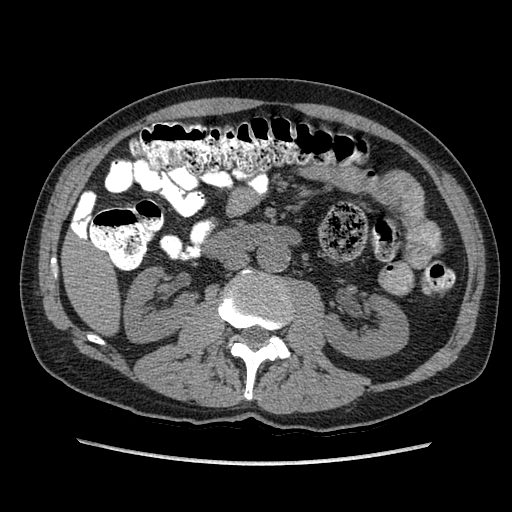
[im 54/108  soft-tissue]
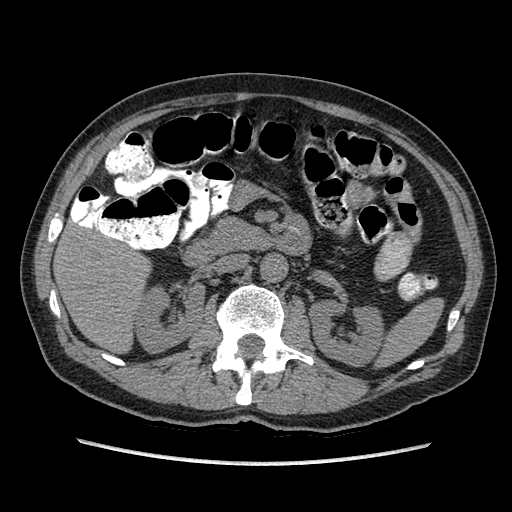
[im 63/108  soft-tissue]
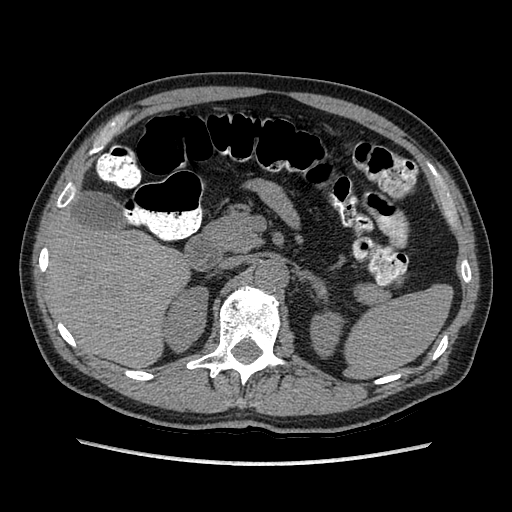
[im 72/108  soft-tissue]
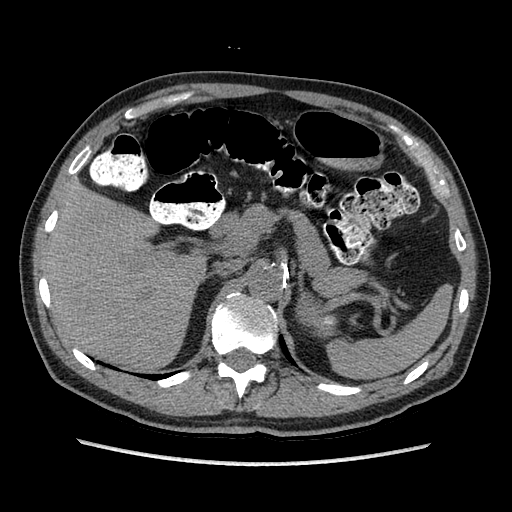
[im 72/108  lung]
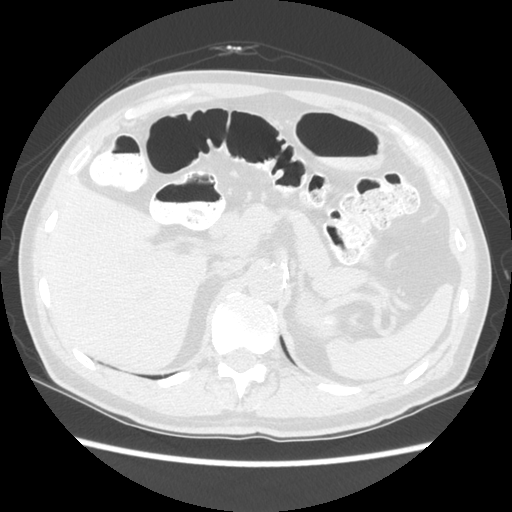
[im 81/108  soft-tissue]
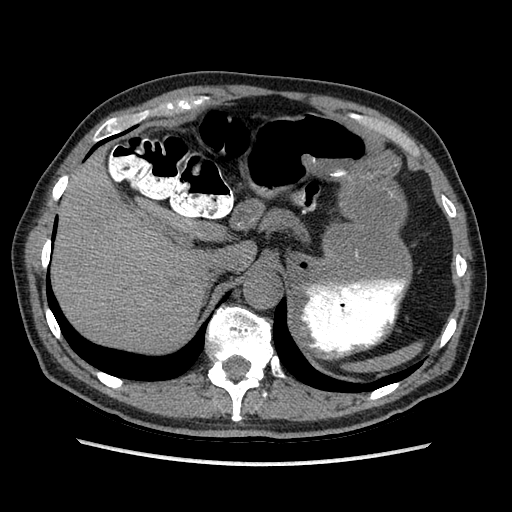
[im 81/108  lung]
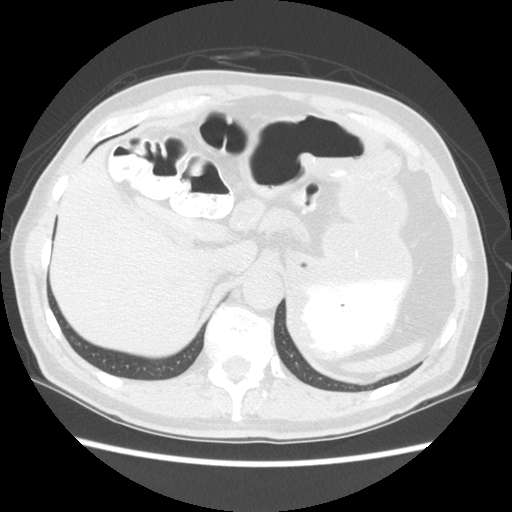
[im 81/108  bone]
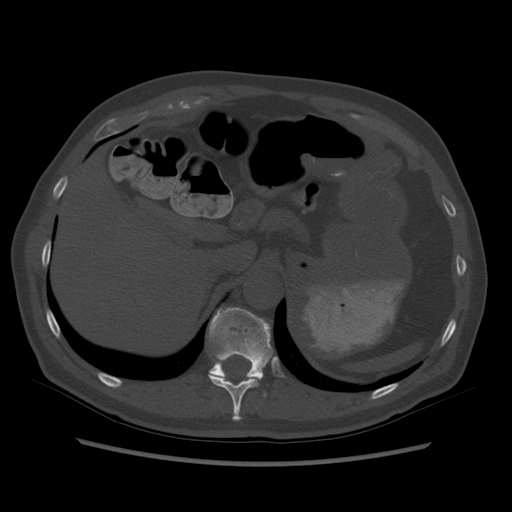
[im 90/108  soft-tissue]
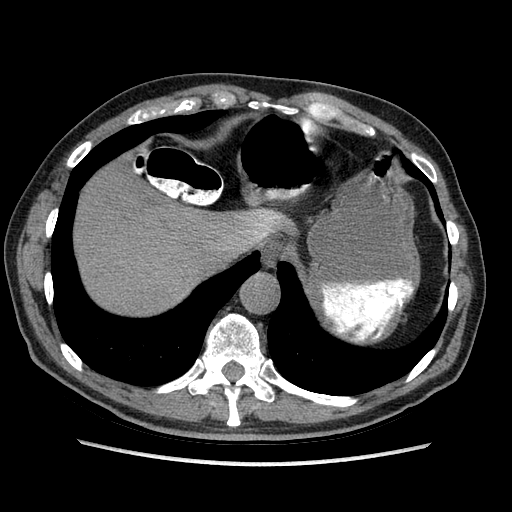
[im 90/108  lung]
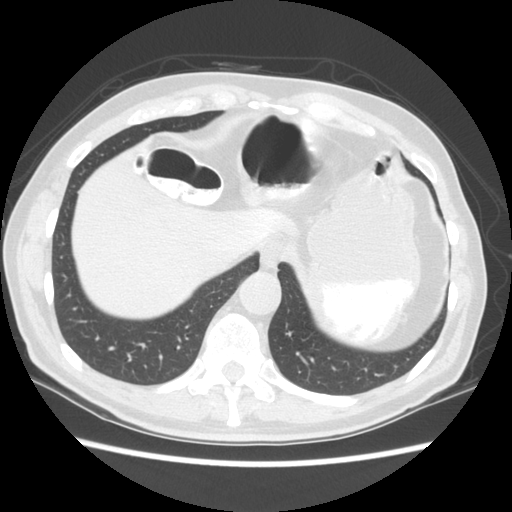
[im 99/108  soft-tissue]
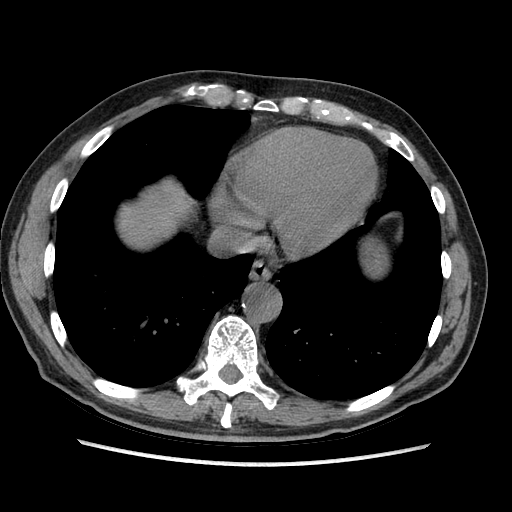
[im 99/108  lung]
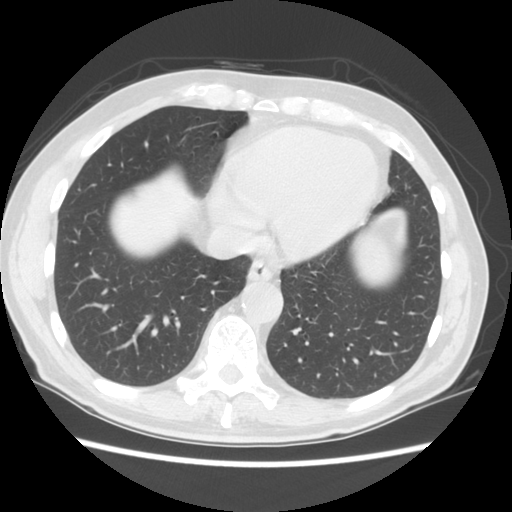

[Series 601: coronal body · coronal · 0.68mm/px · 1 of 104 slices shown, 2 images]
[im 35/104  soft-tissue]
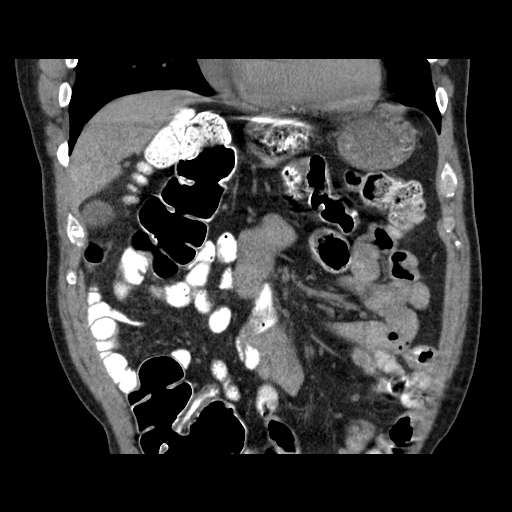
[im 35/104  bone]
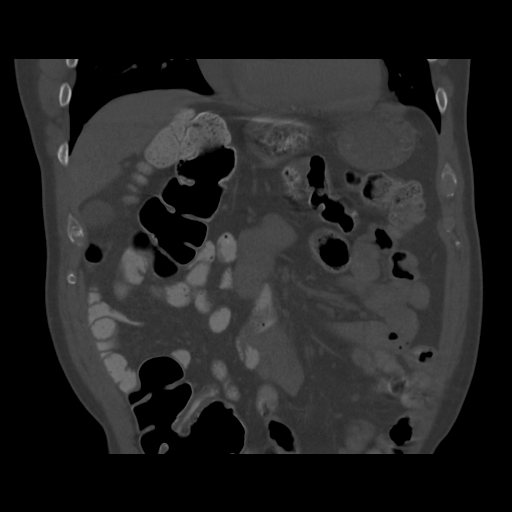

[12 of 36 positions shown; findings below may reference images not displayed]

FINDINGS: Lower chest: At the lung bases, only a 4 mm subpleural nodule which
is noncalcified is seen in the right lower lobe on image 30 series
4. No suspicious lung nodule is seen.

Hepatobiliary: The liver is unremarkable in the unenhanced state. No
calcified gallstones are seen.

Pancreas: The pancreas is normal in size and the pancreatic duct is
not dilated.

Spleen: The spleen is unremarkable.

Adrenals/Urinary Tract: The adrenal glands appear normal with no
nodule or mass. The kidneys are unremarkable in the unenhanced
state. No calculi are seen and there is no evidence of
hydronephrosis. The ureters appear normal in caliber.

Stomach/Bowel: The stomach is moderately distended with oral
contrast and fluid. No small bowel distention is seen. There are
diverticula throughout the descending colon. The terminal ileum is
unremarkable. The appendix is faintly visualized and is unremarkable
as well.

Vascular/Lymphatic: The abdominal aorta is normal in caliber with
moderate abdominal aortic atherosclerosis present. No adenopathy is
seen.

Other: None

Musculoskeletal: There are degenerative changes involving the facet
joints of the lower lumbar spine. The lumbar vertebrae are in normal
alignment.
IMPRESSION: 1. Both adrenal glands appear normal in size. No adrenal nodule or
mass is seen.
2. Descending colon diverticula.
3. Moderate abdominal aortic atherosclerosis.

## 2018-08-23 ENCOUNTER — Telehealth: Payer: Self-pay | Admitting: *Deleted

## 2018-08-23 NOTE — Telephone Encounter (Signed)
A message was left, re: follow up visit. 

## 2018-12-21 ENCOUNTER — Ambulatory Visit: Payer: BLUE CROSS/BLUE SHIELD | Admitting: Cardiology

## 2019-01-15 ENCOUNTER — Other Ambulatory Visit: Payer: Self-pay | Admitting: Cardiology

## 2019-03-15 NOTE — Progress Notes (Signed)
Cardiology Office Note   Date:  03/16/2019   ID:  JEROLD CONSTANTINIDES, DOB [birth year], MRN [id number]  PCP:  London Pepper, MD  Cardiologist:   Minus Breeding, MD    Chief Complaint  Patient presents with  . Atrial Fibrillation    History of Present Illness: Christian Jennings is a 69 y.o. male who presents for follow up of HTN and atrial fib.  Since I last saw him he has done well.  He is regained most of his strength back from his stroke.  He is playing tennis.  The patient denies any new symptoms such as chest discomfort, neck or arm discomfort. There has been no new shortness of breath, PND or orthopnea. There have been no reported palpitations, presyncope or syncope.      Past Medical History:  Diagnosis Date  . CVA (cerebral vascular accident) ([place])    a. 10/2015 occurring in the setting of new-onset untreated atrial fibrillation. Started on Eliquis.  Marland Kitchen HLD (hyperlipidemia)   . HTN (hypertension)   . PAF (paroxysmal atrial fibrillation) ([place])    a. on Eliquis following CVA in 10/2015.    Past Surgical History:  Procedure Laterality Date  . EYE SURGERY     As child for muscle issue  . EYE SURGERY     Cosmetic for drooping eyelid     Current Outpatient Medications  Medication Sig Dispense Refill  . ELIQUIS 5 MG TABS tablet Take 1 tablet by mouth twice daily 180 tablet 0  . hydrochlorothiazide (HYDRODIURIL) 25 MG tablet 1 tablet daily    . lisinopril (PRINIVIL,ZESTRIL) 40 MG tablet TAKE 1 TABLET BY MOUTH ONCE DAILY 90 tablet 3   No current facility-administered medications for this visit.    Allergies:   Patient has no known allergies.   ROS:  Please see the history of present illness.   Otherwise, review of systems are positive for none.   All other systems are reviewed and negative.    PHYSICAL EXAM: VS:  BP (!) 160/90   Pulse 94   Temp 97.9 F (36.6 C)   Ht 5\' 4"  (1.626 m)   Wt 163 lb 6.4 oz (74.1 kg)   BMI 28.05 kg/m  , BMI Body mass index is 28.05  kg/m. GENERAL:  Well appearing NECK:  No jugular venous distention, waveform within normal limits, carotid upstroke brisk and symmetric, no bruits, no thyromegaly LUNGS:  Clear to auscultation bilaterally CHEST:  Unremarkable HEART:  PMI not displaced or sustained,S1 and S2 within normal limits, no S3, no S4, no clicks, no rubs, no murmurs ABD:  Flat, positive bowel sounds normal in frequency in pitch, no bruits, no rebound, no guarding, no midline pulsatile mass, no hepatomegaly, no splenomegaly EXT:  2 plus pulses throughout, no edema, no cyanosis no clubbing   EKG:  EKG is  ordered today. Sinus rhythm, rate 94, axis within normal limits, intervals within normal limits, no acute ST-T wave changes.  Incomplete right bundle branch block   Recent Labs: No results found for requested labs within last 8760 hours.    Lipid Panel   Wt Readings from Last 3 Encounters:  03/16/19 163 lb 6.4 oz (74.1 kg)  10/05/17 139 lb (63 kg)  03/18/16 147 lb 12.8 oz (67 kg)      Other studies Reviewed: Additional studies/ records that were reviewed today include:  Labs Review of the above records demonstrates:  NA   ASSESSMENT AND PLAN:  ATRIAL FIB:   He .  Mr. MARIBEL PEZZULLO has a CHA2DS2 - VASc score of 4 with a risk of stroke of 4%.  No change in therapy.  HTN:    The blood pressure is elevated today but he had a really hard time getting up the elevator on the stairs and into the office.  He says it is usually well controlled.   DYSLIPIDEMIA:    LDL was excellent last year he is getting it checked again in a couple of weeks.  No change in therapy.   COVID EDUCATION: We talked at length about the vaccine. Second   Current medicines are reviewed at length with the patient today.  The patient does not have concerns regarding medicines.  The following changes have been made:  None  Labs/ tests ordered today include: None  Orders Placed This Encounter  Procedures  . EKG 12-Lead     Disposition:   FU with me in 12 months.     Signed, Minus Breeding, MD  03/16/2019 10:26 AM    [facility]

## 2019-03-16 ENCOUNTER — Ambulatory Visit: Payer: Medicare PPO | Admitting: Cardiology

## 2019-03-16 ENCOUNTER — Encounter: Payer: Self-pay | Admitting: Cardiology

## 2019-03-16 ENCOUNTER — Other Ambulatory Visit: Payer: Self-pay

## 2019-03-16 VITALS — BP 160/90 | HR 94 | Temp 97.9°F | Ht 64.0 in | Wt 163.4 lb

## 2019-03-16 DIAGNOSIS — I4891 Unspecified atrial fibrillation: Secondary | ICD-10-CM

## 2019-03-16 DIAGNOSIS — E785 Hyperlipidemia, unspecified: Secondary | ICD-10-CM | POA: Diagnosis not present

## 2019-03-16 DIAGNOSIS — Z7189 Other specified counseling: Secondary | ICD-10-CM

## 2019-03-16 DIAGNOSIS — I10 Essential (primary) hypertension: Secondary | ICD-10-CM | POA: Diagnosis not present

## 2019-03-16 NOTE — Patient Instructions (Signed)
Medication Instructions:  No Changes *If you need a refill on your cardiac medications before your next appointment, please call your pharmacy*  Lab Work: None  Testing/Procedures: None  Follow-Up: At [facility], you and your health needs are our priority.  As part of our continuing mission to provide you with exceptional heart care, we have created designated Provider Care Teams.  These Care Teams include your primary Cardiologist (physician) and Advanced Practice Providers (APPs -  Physician Assistants and Nurse Practitioners) who all work together to provide you with the care you need, when you need it.  Your next appointment:   1 year(s)  You will receive a reminder letter in the mail two months in advance. If you don't receive a letter, please call our office to schedule the follow-up appointment.   The format for your next appointment:   In Person  Provider:   James Hochrein, MD   

## 2019-04-13 ENCOUNTER — Other Ambulatory Visit: Payer: Self-pay | Admitting: Cardiology

## 2019-04-13 NOTE — Telephone Encounter (Signed)
Scr 0.97 on 03/2018 per [facility]

## 2019-05-29 ENCOUNTER — Other Ambulatory Visit: Payer: Self-pay | Admitting: Cardiology

## 2019-10-14 ENCOUNTER — Other Ambulatory Visit: Payer: Self-pay | Admitting: Cardiology

## 2019-12-11 ENCOUNTER — Other Ambulatory Visit: Payer: Self-pay | Admitting: Cardiology

## 2020-02-20 ENCOUNTER — Other Ambulatory Visit: Payer: Self-pay | Admitting: Cardiology

## 2020-04-10 ENCOUNTER — Other Ambulatory Visit: Payer: Self-pay | Admitting: Cardiology

## 2020-05-25 ENCOUNTER — Other Ambulatory Visit: Payer: Self-pay | Admitting: Cardiology

## 2020-06-09 NOTE — Progress Notes (Signed)
Cardiology Office Note   Date:  06/10/2020   ID:  Christian Jennings, DOB 09-28-1950, MRN [id number]  PCP:  London Pepper, MD  Cardiologist:   No primary care provider on file.   Chief Complaint  Patient presents with  . Atrial Fibrillation      History of Present Illness: Christian Jennings is a 70 y.o. male who presents for follow up of HTN and atrial fib.  Since I last saw him he done well.  He gets tennis balls back from a machine golfs but he does not walk the course.  He does do some walking around his neighborhood for exercise. The patient denies any new symptoms such as chest discomfort, neck or arm discomfort. There has been no new shortness of breath, PND or orthopnea. There have been no reported palpitations, presyncope or syncope.    In particular the patient does not report any palpitations but he never really did feel his atrial fibrillation.  He checks his blood pressure occasionally and he has not seen elevated heart rates.  He still has some residual deficits that he works overcome from his previous stroke.   Past Medical History:  Diagnosis Date  . CVA (cerebral vascular accident) (Christian Jennings)    a. 10/2015 occurring in the setting of new-onset untreated atrial fibrillation. Started on Eliquis.  Marland Kitchen HLD (hyperlipidemia)   . HTN (hypertension)   . PAF (paroxysmal atrial fibrillation) ([place])    a. on Eliquis following CVA in 10/2015.    Past Surgical History:  Procedure Laterality Date  . EYE SURGERY     As child for muscle issue  . EYE SURGERY     Cosmetic for drooping eyelid     Current Outpatient Medications  Medication Sig Dispense Refill  . ELIQUIS 5 MG TABS tablet Take 1 tablet by mouth twice daily 180 tablet 1  . hydrochlorothiazide (HYDRODIURIL) 25 MG tablet 1 tablet daily    . lisinopril (ZESTRIL) 40 MG tablet Take 1 tablet by mouth once daily 60 tablet 1   No current facility-administered medications for this visit.    Allergies:   Patient has no known  allergies.   ROS:  Please see the history of present illness.   Otherwise, review of systems are positive for none.   All other systems are reviewed and negative.    PHYSICAL EXAM: VS:  BP 140/71   Pulse 83   Ht 5\' 4"  (1.626 m)   Wt 148 lb (67.1 kg)   SpO2 98%   BMI 25.40 kg/m  , BMI Body mass index is 25.4 kg/m. GENERAL:  Well appearing NECK:  No jugular venous distention, waveform within normal limits, carotid upstroke brisk and symmetric, no bruits, no thyromegaly LUNGS:  Clear to auscultation bilaterally CHEST:  Unremarkable HEART:  PMI not displaced or sustained,S1 and S2 within normal limits, no S3, no S4,   no clicks, no rubs, no murmurs.  ABD:  Flat, positive bowel sounds normal in frequency in pitch, no bruits, no rebound, no guarding, no midline pulsatile mass, no hepatomegaly, no splenomegaly EXT:  2 plus pulses throughout, no edema, no cyanosis no clubbing   EKG:  EKG is ordered today. The ekg ordered today demonstrates sinus rhythm, rate 83, leftward axis, RSR prime V1, possible left atrial argument, no acute ST-T wave changes.   Recent Labs: No results found for requested labs within last 8760 hours.    Lipid Panel    Component Value Date/Time   CHOL 105  02/20/2016 1013   TRIG 50 02/20/2016 1013   HDL 56 02/20/2016 1013   CHOLHDL 1.9 02/20/2016 1013   VLDL 10 02/20/2016 1013   LDLCALC 39 02/20/2016 1013      Wt Readings from Last 3 Encounters:  06/10/20 148 lb (67.1 kg)  03/16/19 163 lb 6.4 oz (74.1 kg)  10/05/17 139 lb (63 kg)      Other studies Reviewed: Additional studies/ records that were reviewed today include: Labs. Review of the above records demonstrates:  Please see elsewhere in the note.     ASSESSMENT AND PLAN:   ATRIAL FIB:   He . [name] has a CHA2DS2 - VASc score of 4.   He tolerates anticoagulation and has no symptomatic recurrence.  No change in therapy.  I have suggested that he get a CBC when he goes in the next  couple of weeks to get his lipid profile.  HTN:    The blood pressure is slightly elevated today but he keeps a blood pressure in his home readings are typically systolics in the 235 range.     DYSLIPIDEMIA:     LDL was 151 in March of last year.  He was started on Lipitor but he stopped taking this and instead is taking plant sterols.  He is going to have this repeated soon.     Current medicines are reviewed at length with the patient today.  The patient does not have concerns regarding medicines.  The following changes have been made:  no change  Labs/ tests ordered today include: None  Orders Placed This Encounter  Procedures  . EKG 12-Lead     Disposition:   FU with me in one year.     Signed, Minus Breeding, MD  06/10/2020 2:16 PM     [facility]

## 2020-06-10 ENCOUNTER — Encounter: Payer: Self-pay | Admitting: Cardiology

## 2020-06-10 ENCOUNTER — Ambulatory Visit: Payer: Medicare PPO | Admitting: Cardiology

## 2020-06-10 ENCOUNTER — Other Ambulatory Visit: Payer: Self-pay

## 2020-06-10 VITALS — BP 140/71 | HR 83 | Ht 64.0 in | Wt 148.0 lb

## 2020-06-10 DIAGNOSIS — E785 Hyperlipidemia, unspecified: Secondary | ICD-10-CM

## 2020-06-10 DIAGNOSIS — I480 Paroxysmal atrial fibrillation: Secondary | ICD-10-CM | POA: Diagnosis not present

## 2020-06-10 DIAGNOSIS — I10 Essential (primary) hypertension: Secondary | ICD-10-CM

## 2020-06-10 NOTE — Patient Instructions (Signed)
Medication Instructions:  Your physician recommends that you continue on your current medications as directed. Please refer to the Current Medication list given to you today.  *If you need a refill on your cardiac medications before your next appointment, please call your pharmacy*  Lab Work: NONE ordered at this time of appointment   If you have labs (blood work) drawn today and your tests are completely normal, you will receive your results only by: MyChart Message (if you have MyChart) OR A paper copy in the mail If you have any lab test that is abnormal or we need to change your treatment, we will call you to review the results.  Testing/Procedures: NONE ordered at this time of appointment   Follow-Up: At [facility], you and your health needs are our priority.  As part of our continuing mission to provide you with exceptional heart care, we have created designated Provider Care Teams.  These Care Teams include your primary Cardiologist (physician) and Advanced Practice Providers (APPs -  Physician Assistants and Nurse Practitioners) who all work together to provide you with the care you need, when you need it.  Your next appointment:   12 month(s)  The format for your next appointment:   In Person  Provider:   James Hochrein, MD    Other Instructions   

## 2020-07-01 DIAGNOSIS — E785 Hyperlipidemia, unspecified: Secondary | ICD-10-CM | POA: Diagnosis not present

## 2020-07-01 DIAGNOSIS — R7309 Other abnormal glucose: Secondary | ICD-10-CM | POA: Diagnosis not present

## 2020-07-01 DIAGNOSIS — R7989 Other specified abnormal findings of blood chemistry: Secondary | ICD-10-CM | POA: Diagnosis not present

## 2020-07-01 DIAGNOSIS — Z1211 Encounter for screening for malignant neoplasm of colon: Secondary | ICD-10-CM | POA: Diagnosis not present

## 2020-07-01 DIAGNOSIS — Z0000 Encounter for general adult medical examination without abnormal findings: Secondary | ICD-10-CM | POA: Diagnosis not present

## 2020-07-01 DIAGNOSIS — I4891 Unspecified atrial fibrillation: Secondary | ICD-10-CM | POA: Diagnosis not present

## 2020-07-01 DIAGNOSIS — R946 Abnormal results of thyroid function studies: Secondary | ICD-10-CM | POA: Diagnosis not present

## 2020-07-01 DIAGNOSIS — Z8673 Personal history of transient ischemic attack (TIA), and cerebral infarction without residual deficits: Secondary | ICD-10-CM | POA: Diagnosis not present

## 2020-07-01 DIAGNOSIS — I10 Essential (primary) hypertension: Secondary | ICD-10-CM | POA: Diagnosis not present

## 2020-09-19 ENCOUNTER — Other Ambulatory Visit: Payer: Self-pay | Admitting: Cardiology

## 2020-09-28 ENCOUNTER — Other Ambulatory Visit: Payer: Self-pay | Admitting: Cardiology

## 2020-09-29 NOTE — Telephone Encounter (Signed)
Prescription refill request for Eliquis received. Indication:afib  Last office visit:hochrein 06/10/20 Scr: 1.110 mg/ 07/01/2020 Age: 88mWeight:67.1kg

## 2020-11-13 DIAGNOSIS — E039 Hypothyroidism, unspecified: Secondary | ICD-10-CM | POA: Diagnosis not present

## 2020-12-01 ENCOUNTER — Other Ambulatory Visit: Payer: Self-pay | Admitting: Cardiology

## 2020-12-29 ENCOUNTER — Other Ambulatory Visit: Payer: Self-pay

## 2020-12-29 MED ORDER — APIXABAN 5 MG PO TABS: 5.0000 mg | ORAL_TABLET | Freq: Two times a day (BID) | ORAL | 1 refills | Status: DC

## 2021-01-20 DIAGNOSIS — E039 Hypothyroidism, unspecified: Secondary | ICD-10-CM | POA: Diagnosis not present

## 2021-01-31 ENCOUNTER — Other Ambulatory Visit: Payer: Self-pay | Admitting: Cardiology

## 2021-03-23 DIAGNOSIS — G479 Sleep disorder, unspecified: Secondary | ICD-10-CM | POA: Diagnosis not present

## 2021-03-23 DIAGNOSIS — F419 Anxiety disorder, unspecified: Secondary | ICD-10-CM | POA: Diagnosis not present

## 2021-04-14 DIAGNOSIS — E039 Hypothyroidism, unspecified: Secondary | ICD-10-CM | POA: Diagnosis not present

## 2021-04-14 DIAGNOSIS — I4891 Unspecified atrial fibrillation: Secondary | ICD-10-CM | POA: Diagnosis not present

## 2021-04-14 DIAGNOSIS — F418 Other specified anxiety disorders: Secondary | ICD-10-CM | POA: Diagnosis not present

## 2021-04-14 DIAGNOSIS — G4700 Insomnia, unspecified: Secondary | ICD-10-CM | POA: Diagnosis not present

## 2021-04-14 DIAGNOSIS — R7309 Other abnormal glucose: Secondary | ICD-10-CM | POA: Diagnosis not present

## 2021-04-15 NOTE — Progress Notes (Signed)
?  ?Cardiology Office Note ? ? ?Date:  04/16/2021  ? ?ID:  Christian Jennings, DOB 1951-01-11, MRN [id number] ? ?PCP:  London Pepper, MD  ?Cardiologist:   None ? ? ?Chief Complaint  ?Patient presents with  ? Atrial Fibrillation  ? ? ?  ?History of Present Illness: ?Christian Jennings is a 71 y.o. male who presents for follow up of HTN and atrial fib.  I was called the other day because he was in atrial fib.  He had low BP and I talked to London Pepper, MD and had them stop the HCTZ and ACE inhibitors.  I suggested beta blocker low dose.  He has been on Eliquis.   ? ?He did start the low-dose beta-blocker and stopped his HCTZ and lisinopril.  He comes in today and he is back in sinus rhythm.  He did not know that he was in A-fib when he went to see his primary.  He did not really feel lightheaded and did not notice his heart racing.  His blood pressure yesterday and he was back with a heart rate of 65 and today he is in sinus so he popped back into sinus at some point spontaneously and probably was in A-fib for about 24 hours. ? ?He denies any new chest pressure, neck or arm discomfort.  He denies any new shortness of breath, PND or orthopnea.  Has had no weight gain or edema. ? ? ?Past Medical History:  ?Diagnosis Date  ? CVA (cerebral vascular accident) [facility])   ? a. 10/2015 occurring in the setting of new-onset untreated atrial fibrillation. Started on Eliquis.  ? HLD (hyperlipidemia)   ? HTN (hypertension)   ? PAF (paroxysmal atrial fibrillation) ([place])   ? a. on Eliquis following CVA in 10/2015.  ? ? ?Past Surgical History:  ?Procedure Laterality Date  ? EYE SURGERY    ? As child for muscle issue  ? EYE SURGERY    ? Cosmetic for drooping eyelid  ? ? ? ?Current Outpatient Medications  ?Medication Sig Dispense Refill  ? apixaban (ELIQUIS) 5 MG TABS tablet Take 1 tablet (5 mg total) by mouth 2 (two) times daily. 180 tablet 1  ? atorvastatin (LIPITOR) 20 MG tablet 1 tablet    ? levothyroxine (SYNTHROID) 25 MCG tablet Take by  mouth.    ? lisinopril (ZESTRIL) 20 MG tablet Take 1 tablet (20 mg total) by mouth daily. 90 tablet 3  ? ?No current facility-administered medications for this visit.  ? ? ?Allergies:   Patient has no known allergies.  ? ?ROS:  Please see the history of present illness.   Otherwise, review of systems are positive for none.   All other systems are reviewed and negative.  ? ? ?PHYSICAL EXAM: ?VS:  BP 140/76   Pulse 64   Ht 5\' 4"  (1.626 m)   Wt 144 lb 9.6 oz (65.6 kg)   SpO2 99%   BMI 24.82 kg/m?  , BMI Body mass index is 24.82 kg/m?. ?GENERAL:  Well appearing ?NECK:  No jugular venous distention, waveform within normal limits, carotid upstroke brisk and symmetric, no bruits, no thyromegaly ?LUNGS:  Clear to auscultation bilaterally ?CHEST:  Unremarkable ?HEART:  PMI not displaced or sustained,S1 and S2 within normal limits, no S3, no S4, no clicks, no rubs, no murmurs ?ABD:  Flat, positive bowel sounds normal in frequency in pitch, no bruits, no rebound, no guarding, no midline pulsatile mass, no hepatomegaly, no splenomegaly ?EXT:  2 plus pulses throughout,  no edema, no cyanosis no clubbing ? ?EKG:  EKG is  ordered today. ?The ekg ordered today demonstrates sinus rhythm, rate 64, leftward axis, RSR prime V1, possible left atrial argument, no acute ST-T wave changes. ? ? ?Recent Labs: ?No results found for requested labs within last 8760 hours.  ? ? ?Lipid Panel ?   ?Component Value Date/Time  ? CHOL 105 02/20/2016 1013  ? TRIG 50 02/20/2016 1013  ? HDL 56 02/20/2016 1013  ? CHOLHDL 1.9 02/20/2016 1013  ? VLDL 10 02/20/2016 1013  ? LDLCALC 39 02/20/2016 1013  ? ?  ? ?Wt Readings from Last 3 Encounters:  ?04/16/21 144 lb 9.6 oz (65.6 kg)  ?06/10/20 148 lb (67.1 kg)  ?03/16/19 163 lb 6.4 oz (74.1 kg)  ?  ? ? ?Other studies Reviewed: ?Additional studies/ records that were reviewed today include: Previous office records from earlier this week. ?Review of the above records demonstrates:  Please see elsewhere in the  note.   ? ? ?ASSESSMENT AND PLAN: ? ? ?ATRIAL FIB:   He . [name] has a CHA2DS2 - VASc score of 4.  I had suggested switching from his lisinopril and HCTZ to metoprolol for blood pressure control and rate control should he cut back in the A-fib.  He really does not want to take a beta-blocker says he did not feel well with it previously.  He is going to continue his anticoagulation.  At this point we are going to wait and see if he has any recurrence and he is going to be checking his pulse more regularly with a pulse oximeter.  If he does have frequent recurrent atrial fibrillation, since it was associated with low blood pressure and rapid rate, I would likely refer him for consideration of ablation as a primary therapy. ? ?HTN:    The blood pressure is slightly up today.  Since he is going to be stopping his beta-blocker I will restart lisinopril but would see if he does not tolerate a lower dose at 20 mg.  ? ?DYSLIPIDEMIA:     LDL was 175 but he wanted to be treated just with plant sterols and diet.  ? ?Current medicines are reviewed at length with the patient today.  The patient does not have concerns regarding medicines. ? ?The following changes have been made:   As above ? ?Labs/ tests ordered today include:   ? ?Orders Placed This Encounter  ?Procedures  ? EKG 12-Lead  ? ? ? ?Disposition:   FU with me in 6 months ? ? ?Signed, ?Minus Breeding, MD  ?04/16/2021 6:42 PM    ?[place] ? ? ?

## 2021-04-16 ENCOUNTER — Encounter: Payer: Self-pay | Admitting: Cardiology

## 2021-04-16 ENCOUNTER — Ambulatory Visit: Payer: Medicare PPO | Admitting: Cardiology

## 2021-04-16 ENCOUNTER — Other Ambulatory Visit: Payer: Self-pay

## 2021-04-16 VITALS — BP 140/76 | HR 64 | Ht 64.0 in | Wt 144.6 lb

## 2021-04-16 DIAGNOSIS — I10 Essential (primary) hypertension: Secondary | ICD-10-CM

## 2021-04-16 DIAGNOSIS — E785 Hyperlipidemia, unspecified: Secondary | ICD-10-CM | POA: Diagnosis not present

## 2021-04-16 DIAGNOSIS — I480 Paroxysmal atrial fibrillation: Secondary | ICD-10-CM

## 2021-04-16 MED ORDER — LISINOPRIL 20 MG PO TABS: 20.0000 mg | ORAL_TABLET | Freq: Every day | ORAL | 3 refills | Status: AC

## 2021-04-16 NOTE — Patient Instructions (Signed)
Medication Instructions:  ?1) STOP Metoprolol ?2) START: Lisinopril 20 mg daily ? ?*If you need a refill on your cardiac medications before your next appointment, please call your pharmacy* ? ? ?Lab Work: ?None ordered today ? ? ?Testing/Procedures: ?None ordered today ? ? ?Follow-Up: ?At [facility], you and your health needs are our priority.  As part of our continuing mission to provide you with exceptional heart care, we have created designated Provider Care Teams.  These Care Teams include your primary Cardiologist (physician) and Advanced Practice Providers (APPs -  Physician Assistants and Nurse Practitioners) who all work together to provide you with the care you need, when you need it. ? ?We recommend signing up for the patient portal called "MyChart".  Sign up information is provided on this After Visit Summary.  MyChart is used to connect with patients for Virtual Visits (Telemedicine).  Patients are able to view lab/test results, encounter notes, upcoming appointments, etc.  Non-urgent messages can be sent to your provider as well.   ?To learn more about what you can do with MyChart, go to [email or web address].   ? ?Your next appointment:   ?6 month(s) ? ?The format for your next appointment:   ?In Person ? ?Provider:   ?Dr. Percival Spanish  ? ? ?

## 2021-05-15 DIAGNOSIS — G4700 Insomnia, unspecified: Secondary | ICD-10-CM | POA: Diagnosis not present

## 2021-05-15 DIAGNOSIS — I4891 Unspecified atrial fibrillation: Secondary | ICD-10-CM | POA: Diagnosis not present

## 2021-05-15 DIAGNOSIS — E039 Hypothyroidism, unspecified: Secondary | ICD-10-CM | POA: Diagnosis not present

## 2021-05-19 ENCOUNTER — Telehealth: Payer: Self-pay | Admitting: Cardiology

## 2021-05-19 DIAGNOSIS — I480 Paroxysmal atrial fibrillation: Secondary | ICD-10-CM

## 2021-05-19 NOTE — Telephone Encounter (Signed)
? ?  Christian Jennings with Dr. Darien Ramus office, she said, last week pt was seen by  Dr. Orland Mustard and pt was back in Afib with intermittent HR above 100 with stable BP. Pt will increased metoprolol from 25 mg half a tablet a day to half a tablet twice a day and to f/u with any other concerns  ?

## 2021-05-20 NOTE — Telephone Encounter (Signed)
LMTCB

## 2021-05-21 NOTE — Telephone Encounter (Signed)
Left message for Christian Jennings, thanks for the information and we will be sending the patient for evaluation for ablation. Left detailed message for patient of recommendations and that the [address] office will call him to schedule an appointment. Referral placed. ?

## 2021-05-27 DIAGNOSIS — E039 Hypothyroidism, unspecified: Secondary | ICD-10-CM | POA: Diagnosis not present

## 2021-06-15 DIAGNOSIS — I10 Essential (primary) hypertension: Secondary | ICD-10-CM | POA: Diagnosis not present

## 2021-06-15 DIAGNOSIS — E039 Hypothyroidism, unspecified: Secondary | ICD-10-CM | POA: Diagnosis not present

## 2021-06-15 DIAGNOSIS — I4891 Unspecified atrial fibrillation: Secondary | ICD-10-CM | POA: Diagnosis not present

## 2021-06-15 DIAGNOSIS — G4700 Insomnia, unspecified: Secondary | ICD-10-CM | POA: Diagnosis not present

## 2021-07-07 ENCOUNTER — Encounter: Payer: Self-pay | Admitting: Cardiology

## 2021-07-07 ENCOUNTER — Ambulatory Visit: Payer: Medicare PPO | Admitting: Cardiology

## 2021-07-07 VITALS — BP 172/96 | HR 68 | Ht 64.0 in | Wt 141.2 lb

## 2021-07-07 DIAGNOSIS — I10 Essential (primary) hypertension: Secondary | ICD-10-CM | POA: Diagnosis not present

## 2021-07-07 DIAGNOSIS — I480 Paroxysmal atrial fibrillation: Secondary | ICD-10-CM | POA: Diagnosis not present

## 2021-07-07 DIAGNOSIS — E785 Hyperlipidemia, unspecified: Secondary | ICD-10-CM | POA: Diagnosis not present

## 2021-07-07 NOTE — Patient Instructions (Signed)
Medication Instructions:  Your physician recommends that you continue on your current medications as directed. Please refer to the Current Medication list given to you today. *If you need a refill on your cardiac medications before your next appointment, please call your pharmacy*  Lab Work: Sept 7  If you have labs (blood work) drawn today and your tests are completely normal, you will receive your results only by: [place] (if you have MyChart) OR A paper copy in the mail If you have any lab test that is abnormal or we need to change your treatment, we will call you to review the results.  Testing/Procedures: Your physician has requested that you have cardiac CT. Cardiac computed tomography (CT) is a painless test that uses an x-ray machine to take clear, detailed pictures of your heart. For further information please visit [email or web address]. Please follow instruction sheet as given.   Your physician has recommended that you have an ablation. Catheter ablation is a medical procedure used to treat some cardiac arrhythmias (irregular heartbeats). During catheter ablation, a long, thin, flexible tube is put into a blood vessel in your groin (upper thigh), or neck. This tube is called an ablation catheter. It is then guided to your heart through the blood vessel. Radio frequency waves destroy small areas of heart tissue where abnormal heartbeats may cause an arrhythmia to start. Please see the instruction sheet given to you today.   Follow-Up: At [facility], you and your health needs are our priority.  As part of our continuing mission to provide you with exceptional heart care, we have created designated Provider Care Teams.  These Care Teams include your primary Cardiologist (physician) and Advanced Practice Providers (APPs -  Physician Assistants and Nurse Practitioners) who all work together to provide you with the care you need, when you need it.  Your physician wants you to  follow-up in: Ablation date picked Sept 26. Christian Kluver RN will call you with instructions for CT and ablation.   We recommend signing up for the patient portal called "MyChart".  Sign up information is provided on this After Visit Summary.  MyChart is used to connect with patients for Virtual Visits (Telemedicine).  Patients are able to view lab/test results, encounter notes, upcoming appointments, etc.  Non-urgent messages can be sent to your provider as well.   To learn more about what you can do with MyChart, go to [email or web address].    Any Other Special Instructions Will Be Listed Below (If Applicable).  Cardiac Ablation Cardiac ablation is a procedure to destroy (ablate) some heart tissue that is sending bad signals. These bad signals cause problems in heart rhythm. The heart has many areas that make these signals. If there are problems in these areas, they can make the heart beat in a way that is not normal. Destroying some tissues can help make the heart rhythm normal. Tell your doctor about: Any allergies you have. All medicines you are taking. These include vitamins, herbs, eye drops, creams, and over-the-counter medicines. Any problems you or family members have had with medicines that make you fall asleep (anesthetics). Any blood disorders you have. Any surgeries you have had. Any medical conditions you have, such as kidney failure. Whether you are pregnant or may be pregnant. What are the risks? This is a safe procedure. But problems may occur, including: Infection. Bruising and bleeding. Bleeding into the chest. Stroke or blood clots. Damage to nearby areas of your body. Allergies to medicines or dyes. The need for a  pacemaker if the normal system is damaged. Failure of the procedure to treat the problem. What happens before the procedure? Medicines Ask your doctor about: Changing or stopping your normal medicines. This is important. Taking aspirin and ibuprofen. Do not  take these medicines unless your doctor tells you to take them. Taking other medicines, vitamins, herbs, and supplements. General instructions Follow instructions from your doctor about what you cannot eat or drink. Plan to have someone take you home from the hospital or clinic. If you will be going home right after the procedure, plan to have someone with you for 24 hours. Ask your doctor what steps will be taken to prevent infection. What happens during the procedure?  An IV tube will be put into one of your veins. You will be given a medicine to help you relax. The skin on your neck or groin will be numbed. A cut (incision) will be made in your neck or groin. A needle will be put through your cut and into a large vein. A tube (catheter) will be put into the needle. The tube will be moved to your heart. Dye may be put through the tube. This helps your doctor see your heart. Small devices (electrodes) on the tube will send out signals. A type of energy will be used to destroy some heart tissue. The tube will be taken out. Pressure will be held on your cut. This helps stop bleeding. A bandage will be put over your cut. The exact procedure may vary among doctors and hospitals. What happens after the procedure? You will be watched until you leave the hospital or clinic. This includes checking your heart rate, breathing rate, oxygen, and blood pressure. Your cut will be watched for bleeding. You will need to lie still for a few hours. Do not drive for 24 hours or as long as your doctor tells you. Summary Cardiac ablation is a procedure to destroy some heart tissue. This is done to treat heart rhythm problems. Tell your doctor about any medical conditions you may have. Tell him or her about all medicines you are taking to treat them. This is a safe procedure. But problems may occur. These include infection, bruising, bleeding, and damage to nearby areas of your body. Follow what your doctor  tells you about food and drink. You may also be told to change or stop some of your medicines. After the procedure, do not drive for 24 hours or as long as your doctor tells you. This information is not intended to replace advice given to you by your health care provider. Make sure you discuss any questions you have with your health care provider. Document Revised: 01/04/2019 Document Reviewed: 01/04/2019 Elsevier Patient Education  [place].

## 2021-07-07 NOTE — Progress Notes (Signed)
Electrophysiology Office Note:    Date:  07/07/2021   ID:  Christian Jennings, DOB 09-26-1950, MRN [id number]  PCP:  London Pepper, MD  [facility] Cardiologist:  None  [facility] Electrophysiologist:  None   Referring MD: London Pepper, MD   Chief Complaint: New patient consult for Afib ablation  History of Present Illness:    Christian Jennings is a 71 y.o. male who presents for an evaluation for Afib ablation at the request of Dr. Percival Spanish. Their medical history includes paroxysmal atrial fibrillation, CVA 10/2015 in the setting of new onset atrial fibrillation (started on Eliquis), hypertension, hyperlipidemia,  He last saw Dr. Percival Spanish on 04/16/2021. Due to low blood pressure his HCTZ and lisinopril were discontinued and he was started on low-dose beta-blocker. At that visit he was back in sinus rhythm; he did not know he was in Afib at his previous PCP appointment. Due to not feeling well previously, he did not want to continue on the beta-blocker. Metoprolol was stopped and he was restarted on lisinopril.   On 05/19/2021 a call from Dr. Darien Ramus office reported that he was back in Afib at a recent visit with intermittent HR above 100 bpm. He was going to take 1/2 a tablet of metoprolol 25 mg twice a day. He was referred to EP for consideration of ablation.  He is accompanied by a family member. He confirms having atrial fibrillation for at least the past 5 years, since the time of his stroke.  By EKG today he is in NSR. However, they note that he felt like he may have been in Afib earlier today. His episodes are sporadic; may occur early in the morning for about an hour.  No bleeding issues while on Eliquis, although he does note minor bruising on his hands.  He denies any chest pain, shortness of breath, or peripheral edema. No lightheadedness, headaches, syncope, orthopnea, or PND.     Past Medical History:  Diagnosis Date   CVA (cerebral vascular accident) ([place])    a. 10/2015  occurring in the setting of new-onset untreated atrial fibrillation. Started on Eliquis.   HLD (hyperlipidemia)    HTN (hypertension)    PAF (paroxysmal atrial fibrillation) ([place])    a. on Eliquis following CVA in 10/2015.    Past Surgical History:  Procedure Laterality Date   EYE SURGERY     As child for muscle issue   EYE SURGERY     Cosmetic for drooping eyelid    Current Medications: Current Meds  Medication Sig   apixaban (ELIQUIS) 5 MG TABS tablet Take 1 tablet (5 mg total) by mouth 2 (two) times daily.   atorvastatin (LIPITOR) 20 MG tablet 1 tablet   levothyroxine (SYNTHROID) 25 MCG tablet Take by mouth.   lisinopril (ZESTRIL) 20 MG tablet Take 1 tablet (20 mg total) by mouth daily.   metoprolol tartrate (LOPRESSOR) 25 MG tablet Take 25 mg by mouth 2 (two) times daily.   traZODone (DESYREL) 100 MG tablet Take 2 tablets by mouth at bedtime.     Allergies:   Patient has no known allergies.   Social History   Socioeconomic History   Marital status: Married    Spouse name: Not on file   Number of children: Not on file   Years of education: Not on file   Highest education level: Not on file  Occupational History   Not on file  Tobacco Use   Smoking status: Never   Smokeless tobacco: Never  Substance and Sexual Activity   Alcohol use: No   Drug use: Not on file   Sexual activity: Yes    Birth control/protection: None  Other Topics Concern   Not on file  Social History Narrative   Not on file   Social Determinants of Health   Financial Resource Strain: Not on file  Food Insecurity: Not on file  Transportation Needs: Not on file  Physical Activity: Not on file  Stress: Not on file  Social Connections: Not on file     Family History: The patient's family history includes Alcoholism in his father; COPD in his father; Heart attack in his maternal uncle; Throat cancer in his brother; Transient ischemic attack in his mother.  ROS:   Please see the history of  present illness.    (+) Ecchymosis of bilateral hands All other systems reviewed and are negative.  EKGs/Labs/Other Studies Reviewed:    The following studies were reviewed today:  01/26/2016  Renal Artery Doppler FINDINGS: Right Kidney:   Length: 11.5 cm. Echogenicity within normal limits. No mass or hydronephrosis visualized.   Left Kidney:   Length: 11.7 cm. Echogenicity within normal limits. No mass or hydronephrosis visualized.   Bladder: Normal degree of distension. Bilateral ureteral jets are noted.   RENAL DUPLEX ULTRASOUND   Right Renal Artery Velocities:   Origin:  157 cm/sec   Mid:  129 cm/sec   Hilum:  104 cm/sec   Interlobar:  56 cm/sec   Arcuate:  27 Cm/sec   Left Renal Artery Velocities:   Origin:  263 cm/sec   Mid:  169 cm/sec   Hilum:  178 cm/sec   Interlobar:  33 cm/sec   Arcuate:  20 cm/sec   Aortic Velocity:  65 Cm/sec   Right Renal-Aortic Ratios:   Origin: 2.42   Mid:  1.99   Hilum: 1.60   Interlobar: 0.87   Arcuate: 0.42   Left Renal-Aortic Ratios:   Origin: 4.05   Mid: 2.60   Hilum: 2.74   Interlobar: 0.51   Arcuate: 0.51   IMPRESSION: No hydronephrosis or renal obstruction is noted. Kidneys appear to be normal in size and appearance.   Elevated peak systolic velocity is noted at origin of left renal artery consistent with 60% or greater stenotic lesion. CT angiography of the renal arteries is recommended for further evaluation.  11/16/2015  Echo TTE Study Conclusions   - Left ventricle: The cavity size was normal. Wall thickness was    normal. Systolic function was vigorous. The estimated ejection    fraction was in the range of 65% to 70%. The study was not    technically sufficient to allow evaluation of LV diastolic    dysfunction due to atrial fibrillation.  - Aortic valve: There was trivial regurgitation. Valve area (VTI):    2.7 cm^2. Valve area (Vmax): 2.46 cm^2.  - Left atrium: The atrium was  mildly dilated.  - Technically adequate study.   11/15/2015  Bilateral Carotid Doppler Summary:  Right: intimal wall thickening CCA. Mild mixed plaque origin ICA.  Left: intimal wall thickening CCA. Mild soft plaque origin ICA.  Bilateral: 1-39% ICA plaquing. Vertebral artery flow is antegrade.    EKG:   EKG is personally reviewed.  07/07/2021: Sinus rhythm.   Recent Labs: No results found for requested labs within last 8760 hours.   Recent Lipid Panel    Component Value Date/Time   CHOL 105 02/20/2016 1013   TRIG 50 02/20/2016 1013   HDL 56  02/20/2016 1013   CHOLHDL 1.9 02/20/2016 1013   VLDL 10 02/20/2016 1013   LDLCALC 39 02/20/2016 1013    Physical Exam:    VS:  BP (!) 172/96   Pulse 68   Ht '5\' 4"'$  (1.626 m)   Wt 141 lb 3.2 oz (64 kg)   SpO2 97%   BMI 24.24 kg/m     Wt Readings from Last 3 Encounters:  07/07/21 141 lb 3.2 oz (64 kg)  04/16/21 144 lb 9.6 oz (65.6 kg)  06/10/20 148 lb (67.1 kg)     GEN: Well nourished, well developed in no acute distress HEENT: Normal NECK: No JVD; No carotid bruits LYMPHATICS: No lymphadenopathy CARDIAC: RRR, no murmurs, rubs, gallops RESPIRATORY:  Clear to auscultation without rales, wheezing or rhonchi  ABDOMEN: Soft, non-tender, non-distended MUSCULOSKELETAL:  No edema; No deformity  SKIN: Warm and dry NEUROLOGIC:  Alert and oriented x 3 PSYCHIATRIC:  Normal affect       ASSESSMENT:    1. PAF (paroxysmal atrial fibrillation) ([place])   2. Essential hypertension   3. Hyperlipidemia, unspecified hyperlipidemia type    PLAN:    In order of problems listed above:  #Paroxysmal atrial fibrillation Symptomatic.  Associated with hypotensive episodes.  I discussed treatment strategies with the patient today including antiarrhythmic drug therapy and catheter ablation.  I discussed the pros and cons of each strategy.  I discussed the procedural details of catheter ablation including the risks, recovery and likelihood of  success.  He takes Eliquis for stroke prophylaxis.  He would like some time to think about his options before letting us know how he would like to proceed which I think is very reasonable.  Discussed treatment options today for his AF including antiarrhythmic drug therapy and ablation. Discussed risks, recovery and likelihood of success. Discussed potential need for repeat ablation procedures and antiarrhythmic drugs after an initial ablation. .  Risk, benefits, and alternatives to EP study and radiofrequency ablation for afib were also discussed in detail today. These risks include but are not limited to stroke, bleeding, vascular damage, tamponade, perforation, damage to the esophagus, lungs, and other structures, pulmonary vein stenosis, worsening renal function, and death.Carto, ICE, anesthesia are requested for the procedure.  Will also obtain CT PV protocol prior to the procedure to exclude LAA thrombus and further evaluate atrial anatomy.  #Hypertension Above goal today.  Continue monitoring blood pressures at home 1-2 times per week and write these values down.  This should be brought to your primary care physician for further medication titration.    Medication Adjustments/Labs and Tests Ordered: Current medicines are reviewed at length with the patient today.  Concerns regarding medicines are outlined above.  Orders Placed This Encounter  Procedures   EKG 12-Lead   No orders of the defined types were placed in this encounter.   I,Mathew Stumpf,acting as a Education administrator for Vickie Epley, MD.,have documented all relevant documentation on the behalf of Vickie Epley, MD,as directed by  Vickie Epley, MD while in the presence of Vickie Epley, MD.  I, Vickie Epley, MD, have reviewed all documentation for this visit. The documentation on 07/07/21 for the exam, diagnosis, procedures, and orders are all accurate and complete.   Signed, Hilton Cork. Quentin Ore, MD, [facility],  [facility] 07/07/2021 8:42 PM    Electrophysiology [facility]

## 2021-08-11 ENCOUNTER — Telehealth: Payer: Self-pay | Admitting: Cardiology

## 2021-08-11 NOTE — Telephone Encounter (Signed)
Patient says to call on Monday since he is out of town

## 2021-08-11 NOTE — Telephone Encounter (Signed)
Patient is returning phone call from yesterday. Patient was out of town.

## 2021-08-17 NOTE — Telephone Encounter (Signed)
Spoke to patient and advised I could not find where anyone called him.  Verbalized understanding.

## 2021-09-14 ENCOUNTER — Encounter: Payer: Self-pay | Admitting: *Deleted

## 2021-09-14 ENCOUNTER — Telehealth: Payer: Self-pay | Admitting: *Deleted

## 2021-09-14 DIAGNOSIS — I480 Paroxysmal atrial fibrillation: Secondary | ICD-10-CM

## 2021-09-14 DIAGNOSIS — Z01818 Encounter for other preprocedural examination: Secondary | ICD-10-CM

## 2021-09-14 NOTE — Telephone Encounter (Signed)
Went over CT and Ablation instruction. Answered all questions. Verbalized understanding and agreement.

## 2021-09-17 DIAGNOSIS — K469 Unspecified abdominal hernia without obstruction or gangrene: Secondary | ICD-10-CM | POA: Diagnosis not present

## 2021-09-17 DIAGNOSIS — E785 Hyperlipidemia, unspecified: Secondary | ICD-10-CM | POA: Diagnosis not present

## 2021-09-17 DIAGNOSIS — I10 Essential (primary) hypertension: Secondary | ICD-10-CM | POA: Diagnosis not present

## 2021-09-17 DIAGNOSIS — I4891 Unspecified atrial fibrillation: Secondary | ICD-10-CM | POA: Diagnosis not present

## 2021-09-17 DIAGNOSIS — G4700 Insomnia, unspecified: Secondary | ICD-10-CM | POA: Diagnosis not present

## 2021-09-17 DIAGNOSIS — E039 Hypothyroidism, unspecified: Secondary | ICD-10-CM | POA: Diagnosis not present

## 2021-10-12 ENCOUNTER — Other Ambulatory Visit: Payer: Self-pay | Admitting: Cardiology

## 2021-10-12 DIAGNOSIS — I480 Paroxysmal atrial fibrillation: Secondary | ICD-10-CM

## 2021-10-12 NOTE — Telephone Encounter (Signed)
Eliquis '5mg'$  refill request received. Patient is 71 years old, weight-64kg, Crea-1.25 on 04/14/2021 via [facility] from [place], [place], and last seen by Dr. Quentin Ore on 07/07/2021. Dose is appropriate based on dosing criteria. Will send in refill to requested pharmacy.

## 2021-10-21 NOTE — Addendum Note (Signed)
Addended by: Thora Lance on: 10/21/2021 04:34 PM   Modules accepted: Orders

## 2021-10-22 ENCOUNTER — Ambulatory Visit: Payer: Medicare PPO | Attending: Cardiology

## 2021-10-22 DIAGNOSIS — I480 Paroxysmal atrial fibrillation: Secondary | ICD-10-CM

## 2021-10-22 DIAGNOSIS — Z01818 Encounter for other preprocedural examination: Secondary | ICD-10-CM

## 2021-10-22 LAB — CBC WITH DIFFERENTIAL/PLATELET
Basophils Absolute: 0 10*3/uL (ref 0.0–0.2)
Basos: 0 %
EOS (ABSOLUTE): 0.1 10*3/uL (ref 0.0–0.4)
Eos: 2 %
Hematocrit: 43.8 % (ref 37.5–51.0)
Hemoglobin: 15.3 g/dL (ref 13.0–17.7)
Lymphocytes Absolute: 1.4 10*3/uL (ref 0.7–3.1)
Lymphs: 22 %
MCH: 32.3 pg (ref 26.6–33.0)
MCHC: 34.9 g/dL (ref 31.5–35.7)
MCV: 92 fL (ref 79–97)
Monocytes Absolute: 0.5 10*3/uL (ref 0.1–0.9)
Monocytes: 7 %
Neutrophils Absolute: 4.4 10*3/uL (ref 1.4–7.0)
Neutrophils: 69 %
Platelets: 141 10*3/uL — ABNORMAL LOW (ref 150–450)
RBC: 4.74 x10E6/uL (ref 4.14–5.80)
RDW: 13.7 % (ref 11.6–15.4)
WBC: 6.4 10*3/uL (ref 3.4–10.8)

## 2021-10-22 LAB — BASIC METABOLIC PANEL
BUN/Creatinine Ratio: 20 (ref 10–24)
BUN: 21 mg/dL (ref 8–27)
CO2: 30 mmol/L — ABNORMAL HIGH (ref 20–29)
Calcium: 9.6 mg/dL (ref 8.6–10.2)
Chloride: 104 mmol/L (ref 96–106)
Creatinine, Ser: 1.07 mg/dL (ref 0.76–1.27)
Glucose: 91 mg/dL (ref 70–99)
Potassium: 4.3 mmol/L (ref 3.5–5.2)
Sodium: 140 mmol/L (ref 134–144)
eGFR: 74 mL/min/{1.73_m2} (ref >59)

## 2021-10-29 DIAGNOSIS — E785 Hyperlipidemia, unspecified: Secondary | ICD-10-CM | POA: Diagnosis not present

## 2021-10-29 DIAGNOSIS — I4891 Unspecified atrial fibrillation: Secondary | ICD-10-CM | POA: Diagnosis not present

## 2021-10-29 DIAGNOSIS — E039 Hypothyroidism, unspecified: Secondary | ICD-10-CM | POA: Diagnosis not present

## 2021-11-02 ENCOUNTER — Telehealth (HOSPITAL_COMMUNITY): Payer: Self-pay | Admitting: *Deleted

## 2021-11-02 NOTE — Telephone Encounter (Signed)
Reaching out to patient to offer assistance regarding upcoming cardiac imaging study; pt verbalizes understanding of appt date/time, parking situation and where to check in, and verified current allergies; name and call back number provided for further questions should they arise  Christian Clement RN Navigator Cardiac [place] [facility] [phone] office [phone] cell  Patient to take his daily metoprolol tartrate two hours prior to his cardiac CT scan.

## 2021-11-03 ENCOUNTER — Encounter (HOSPITAL_BASED_OUTPATIENT_CLINIC_OR_DEPARTMENT_OTHER): Payer: Self-pay

## 2021-11-03 ENCOUNTER — Ambulatory Visit (HOSPITAL_BASED_OUTPATIENT_CLINIC_OR_DEPARTMENT_OTHER)
Admission: RE | Admit: 2021-11-03 | Discharge: 2021-11-03 | Disposition: A | Discharge status: Home / Self Care | Payer: Medicare PPO | Source: Ambulatory Visit | Attending: Cardiology | Admitting: Cardiology

## 2021-11-03 DIAGNOSIS — Z01818 Encounter for other preprocedural examination: Secondary | ICD-10-CM | POA: Insufficient documentation

## 2021-11-03 DIAGNOSIS — I480 Paroxysmal atrial fibrillation: Secondary | ICD-10-CM | POA: Insufficient documentation

## 2021-11-03 MED ORDER — IOHEXOL 350 MG/ML SOLN
100.0000 mL | Freq: Once | INTRAVENOUS | Status: AC | PRN
  Administered 2021-11-03: 80 mL via INTRAVENOUS

## 2021-11-09 NOTE — Anesthesia Preprocedure Evaluation (Signed)
Anesthesia Evaluation  Patient identified by MRN, date of birth, ID band Patient awake    Reviewed: Allergy & Precautions, NPO status , Patient's Chart, lab work & pertinent test results, reviewed documented beta blocker date and time   History of Anesthesia Complications Negative for: history of anesthetic complications  Airway Mallampati: II  TM Distance: >3 FB Neck ROM: Full    Dental  (+) Dental Advisory Given   Pulmonary neg pulmonary ROS,    breath sounds clear to auscultation       Cardiovascular hypertension, Pt. on medications and Pt. on home beta blockers (-) angina+ dysrhythmias Atrial Fibrillation  Rhythm:Irregular Rate:Normal  '17 ECHO: EF 60-65%, vigorous LF, normal RVF, trivial AI   Neuro/Psych CVA (L sided weakness), Residual Symptoms    GI/Hepatic negative GI ROS, Neg liver ROS,   Endo/Other  Hypothyroidism   Renal/GU negative Renal ROS     Musculoskeletal   Abdominal   Peds  Hematology eliquis   Anesthesia Other Findings   Reproductive/Obstetrics                            Anesthesia Physical Anesthesia Plan  ASA: 3  Anesthesia Plan: General   Post-op Pain Management: Tylenol PO (pre-op)*   Induction: Intravenous  PONV Risk Score and Plan: 2 and Dexamethasone  Airway Management Planned: Oral ETT  Additional Equipment: None  Intra-op Plan:   Post-operative Plan: Extubation in OR  Informed Consent: I have reviewed the patients History and Physical, chart, labs and discussed the procedure including the risks, benefits and alternatives for the proposed anesthesia with the patient or authorized representative who has indicated his/her understanding and acceptance.     Dental advisory given  Plan Discussed with: CRNA and Surgeon  Anesthesia Plan Comments:        Anesthesia Quick Evaluation

## 2021-11-09 NOTE — Pre-Procedure Instructions (Signed)
Instructed patient on the following items: Arrival time 0530 Nothing to eat or drink after midnight No meds AM of procedure Responsible person to drive you home and stay with you for 24 hrs  Have you missed any doses of anti-coagulant Eliquis- hasn't missed any doses    

## 2021-11-10 ENCOUNTER — Other Ambulatory Visit (HOSPITAL_COMMUNITY): Payer: Self-pay

## 2021-11-10 ENCOUNTER — Encounter (HOSPITAL_COMMUNITY)
Admission: RE | Disposition: A | Discharge status: Home / Self Care | Payer: Medicare PPO | Source: Ambulatory Visit | Attending: Cardiology

## 2021-11-10 ENCOUNTER — Ambulatory Visit (HOSPITAL_COMMUNITY)
Admission: RE | Admit: 2021-11-10 | Discharge: 2021-11-10 | Disposition: A | Discharge status: Home / Self Care | Payer: Medicare PPO | Source: Ambulatory Visit | Attending: Cardiology | Admitting: Cardiology

## 2021-11-10 ENCOUNTER — Ambulatory Visit (HOSPITAL_BASED_OUTPATIENT_CLINIC_OR_DEPARTMENT_OTHER): Payer: Medicare PPO | Admitting: Certified Registered"

## 2021-11-10 ENCOUNTER — Other Ambulatory Visit: Payer: Self-pay

## 2021-11-10 ENCOUNTER — Ambulatory Visit (HOSPITAL_COMMUNITY): Payer: Medicare PPO | Admitting: Certified Registered"

## 2021-11-10 DIAGNOSIS — I10 Essential (primary) hypertension: Secondary | ICD-10-CM | POA: Insufficient documentation

## 2021-11-10 DIAGNOSIS — E039 Hypothyroidism, unspecified: Secondary | ICD-10-CM | POA: Insufficient documentation

## 2021-11-10 DIAGNOSIS — I4891 Unspecified atrial fibrillation: Secondary | ICD-10-CM | POA: Diagnosis not present

## 2021-11-10 DIAGNOSIS — I480 Paroxysmal atrial fibrillation: Secondary | ICD-10-CM | POA: Insufficient documentation

## 2021-11-10 DIAGNOSIS — I69352 Hemiplegia and hemiparesis following cerebral infarction affecting left dominant side: Secondary | ICD-10-CM

## 2021-11-10 DIAGNOSIS — E785 Hyperlipidemia, unspecified: Secondary | ICD-10-CM | POA: Insufficient documentation

## 2021-11-10 DIAGNOSIS — I352 Nonrheumatic aortic (valve) stenosis with insufficiency: Secondary | ICD-10-CM | POA: Diagnosis not present

## 2021-11-10 DIAGNOSIS — Z7901 Long term (current) use of anticoagulants: Secondary | ICD-10-CM | POA: Insufficient documentation

## 2021-11-10 DIAGNOSIS — I69354 Hemiplegia and hemiparesis following cerebral infarction affecting left non-dominant side: Secondary | ICD-10-CM | POA: Diagnosis not present

## 2021-11-10 HISTORY — PX: ATRIAL FIBRILLATION ABLATION: EP1191

## 2021-11-10 LAB — POCT ACTIVATED CLOTTING TIME
Activated Clotting Time: 335 seconds
Activated Clotting Time: 372 seconds

## 2021-11-10 SURGERY — ATRIAL FIBRILLATION ABLATION
Anesthesia: General

## 2021-11-10 MED ORDER — PROTAMINE SULFATE 10 MG/ML IV SOLN
INTRAVENOUS | Status: DC | PRN
  Administered 2021-11-10: 35 mg via INTRAVENOUS

## 2021-11-10 MED ORDER — COLCHICINE 0.6 MG PO TABS
0.6000 mg | ORAL_TABLET | Freq: Two times a day (BID) | ORAL | Status: DC
  Administered 2021-11-10: 0.6 mg via ORAL
  Filled 2021-11-10: qty 1

## 2021-11-10 MED ORDER — FENTANYL CITRATE (PF) 250 MCG/5ML IJ SOLN
INTRAMUSCULAR | Status: DC | PRN
  Administered 2021-11-10 (×2): 50 ug via INTRAVENOUS

## 2021-11-10 MED ORDER — APIXABAN 5 MG PO TABS
5.0000 mg | ORAL_TABLET | Freq: Two times a day (BID) | ORAL | Status: DC
  Administered 2021-11-10: 5 mg via ORAL
  Filled 2021-11-10: qty 1

## 2021-11-10 MED ORDER — EPHEDRINE SULFATE-NACL 50-0.9 MG/10ML-% IV SOSY
PREFILLED_SYRINGE | INTRAVENOUS | Status: DC | PRN
  Administered 2021-11-10: 5 mg via INTRAVENOUS

## 2021-11-10 MED ORDER — PHENYLEPHRINE HCL-NACL 20-0.9 MG/250ML-% IV SOLN
INTRAVENOUS | Status: DC | PRN
  Administered 2021-11-10: 40 ug/min via INTRAVENOUS

## 2021-11-10 MED ORDER — LIDOCAINE 2% (20 MG/ML) 5 ML SYRINGE
INTRAMUSCULAR | Status: DC | PRN
  Administered 2021-11-10: 40 mg via INTRAVENOUS

## 2021-11-10 MED ORDER — ROCURONIUM BROMIDE 10 MG/ML (PF) SYRINGE
PREFILLED_SYRINGE | INTRAVENOUS | Status: DC | PRN
  Administered 2021-11-10: 70 mg via INTRAVENOUS

## 2021-11-10 MED ORDER — SODIUM CHLORIDE 0.9 % IV SOLN: INTRAVENOUS | Status: DC

## 2021-11-10 MED ORDER — PROPOFOL 10 MG/ML IV BOLUS
INTRAVENOUS | Status: DC | PRN
  Administered 2021-11-10: 130 mg via INTRAVENOUS

## 2021-11-10 MED ORDER — HEPARIN SODIUM (PORCINE) 1000 UNIT/ML IJ SOLN
INTRAMUSCULAR | Status: DC | PRN
  Administered 2021-11-10: 1000 [IU] via INTRAVENOUS

## 2021-11-10 MED ORDER — DEXAMETHASONE SODIUM PHOSPHATE 10 MG/ML IJ SOLN
INTRAMUSCULAR | Status: DC | PRN
  Administered 2021-11-10: 10 mg via INTRAVENOUS

## 2021-11-10 MED ORDER — ONDANSETRON HCL 4 MG/2ML IJ SOLN: 4.0000 mg | Freq: Four times a day (QID) | INTRAMUSCULAR | Status: DC | PRN

## 2021-11-10 MED ORDER — SODIUM CHLORIDE 0.9% FLUSH: 3.0000 mL | INTRAVENOUS | Status: DC | PRN

## 2021-11-10 MED ORDER — ONDANSETRON HCL 4 MG/2ML IJ SOLN
INTRAMUSCULAR | Status: DC | PRN
  Administered 2021-11-10: 4 mg via INTRAVENOUS

## 2021-11-10 MED ORDER — HEPARIN SODIUM (PORCINE) 1000 UNIT/ML IJ SOLN
INTRAMUSCULAR | Status: DC | PRN
  Administered 2021-11-10: 2000 [IU] via INTRAVENOUS
  Administered 2021-11-10: 10000 [IU] via INTRAVENOUS

## 2021-11-10 MED ORDER — PANTOPRAZOLE SODIUM 40 MG PO TBEC
40.0000 mg | DELAYED_RELEASE_TABLET | Freq: Every day | ORAL | Status: DC
  Administered 2021-11-10: 40 mg via ORAL
  Filled 2021-11-10: qty 1

## 2021-11-10 MED ORDER — SODIUM CHLORIDE 0.9 % IV SOLN: 250.0000 mL | INTRAVENOUS | Status: DC | PRN

## 2021-11-10 MED ORDER — SUGAMMADEX SODIUM 200 MG/2ML IV SOLN
INTRAVENOUS | Status: DC | PRN
  Administered 2021-11-10: 100 mg via INTRAVENOUS
  Administered 2021-11-10: 200 mg via INTRAVENOUS

## 2021-11-10 MED ORDER — COLCHICINE 0.6 MG PO TABS
0.6000 mg | ORAL_TABLET | Freq: Two times a day (BID) | ORAL | 0 refills | Status: DC
  Filled 2021-11-10: qty 10, 5d supply, fill #0

## 2021-11-10 MED ORDER — HEPARIN SODIUM (PORCINE) 1000 UNIT/ML IJ SOLN
INTRAMUSCULAR | Status: AC
  Filled 2021-11-10: qty 10

## 2021-11-10 MED ORDER — ACETAMINOPHEN 500 MG PO TABS
ORAL_TABLET | ORAL | Status: AC
  Administered 2021-11-10: 1000 mg via ORAL
  Filled 2021-11-10: qty 2

## 2021-11-10 MED ORDER — ACETAMINOPHEN 325 MG PO TABS: 650.0000 mg | ORAL_TABLET | ORAL | Status: DC | PRN

## 2021-11-10 MED ORDER — SODIUM CHLORIDE 0.9% FLUSH: 3.0000 mL | Freq: Two times a day (BID) | INTRAVENOUS | Status: DC

## 2021-11-10 MED ORDER — HEPARIN (PORCINE) IN NACL 1000-0.9 UT/500ML-% IV SOLN
INTRAVENOUS | Status: DC | PRN
  Administered 2021-11-10 (×3): 500 mL

## 2021-11-10 MED ORDER — PANTOPRAZOLE SODIUM 40 MG PO TBEC
40.0000 mg | DELAYED_RELEASE_TABLET | Freq: Every day | ORAL | 0 refills | Status: DC
  Filled 2021-11-10: qty 45, 45d supply, fill #0

## 2021-11-10 MED ORDER — HEPARIN (PORCINE) IN NACL 1000-0.9 UT/500ML-% IV SOLN
INTRAVENOUS | Status: AC
  Filled 2021-11-10: qty 1500

## 2021-11-10 MED ORDER — ACETAMINOPHEN 500 MG PO TABS
1000.0000 mg | ORAL_TABLET | Freq: Once | ORAL | Status: AC
Start: 2021-11-10 — End: 2021-11-10

## 2021-11-10 MED ORDER — PHENYLEPHRINE 80 MCG/ML (10ML) SYRINGE FOR IV PUSH (FOR BLOOD PRESSURE SUPPORT)
PREFILLED_SYRINGE | INTRAVENOUS | Status: DC | PRN
  Administered 2021-11-10: 80 ug via INTRAVENOUS
  Administered 2021-11-10: 160 ug via INTRAVENOUS

## 2021-11-10 SURGICAL SUPPLY — 18 items
BAG SNAP BAND KOVER 36X36 (MISCELLANEOUS) IMPLANT
CATH ABLAT QDOT MICRO BI TC DF (CATHETERS) IMPLANT
CATH OCTARAY 2.0 F 3-3-3-3-3 (CATHETERS) IMPLANT
CATH S-M CIRCA TEMP PROBE (CATHETERS) IMPLANT
CATH SOUNDSTAR ECO REPROCESSED (CATHETERS) IMPLANT
CATH WEB BI DIR CSDF CRV REPRO (CATHETERS) IMPLANT
CLOSURE PERCLOSE PROSTYLE (VASCULAR PRODUCTS) IMPLANT
COVER SWIFTLINK CONNECTOR (BAG) ×1 IMPLANT
PACK EP LATEX FREE (CUSTOM PROCEDURE TRAY) ×1
PACK EP LF (CUSTOM PROCEDURE TRAY) ×1 IMPLANT
PAD DEFIB RADIO PHYSIO CONN (PAD) ×1 IMPLANT
PATCH CARTO3 (PAD) IMPLANT
SHEATH BAYLIS TRANSSEPTAL 98CM (NEEDLE) IMPLANT
SHEATH CARTO VIZIGO SM CVD (SHEATH) IMPLANT
SHEATH PINNACLE 8F 10CM (SHEATH) IMPLANT
SHEATH PINNACLE 9F 10CM (SHEATH) IMPLANT
SHEATH PROBE COVER 6X72 (BAG) IMPLANT
TUBING SMART ABLATE COOLFLOW (TUBING) IMPLANT

## 2021-11-10 NOTE — Anesthesia Procedure Notes (Signed)
Procedure Name: Intubation Date/Time: 11/10/2021 7:46 AM  Performed by: Gaylene Brooks, CRNAPre-anesthesia Checklist: Patient identified, Emergency Drugs available, Suction available and Patient being monitored Patient Re-evaluated:Patient Re-evaluated prior to induction Oxygen Delivery Method: Circle System Utilized Preoxygenation: Pre-oxygenation with 100% oxygen Induction Type: IV induction Ventilation: Mask ventilation without difficulty Laryngoscope Size: Miller and 2 Grade View: Grade II Tube type: Oral Tube size: 7.5 mm Number of attempts: 1 Airway Equipment and Method: Stylet and Oral airway Placement Confirmation: ETT inserted through vocal cords under direct vision, positive ETCO2 and breath sounds checked- equal and bilateral Secured at: 22 cm Tube secured with: Tape Dental Injury: Teeth and Oropharynx as per pre-operative assessment

## 2021-11-10 NOTE — Anesthesia Postprocedure Evaluation (Signed)
Anesthesia Post Note  Patient: Christian Jennings  Procedure(s) Performed: ATRIAL FIBRILLATION ABLATION     Patient location during evaluation: Phase II Anesthesia Type: General Level of consciousness: awake and alert, patient cooperative and oriented Pain management: pain level controlled Vital Signs Assessment: post-procedure vital signs reviewed and stable Respiratory status: nonlabored ventilation, spontaneous breathing and respiratory function stable Cardiovascular status: blood pressure returned to baseline and stable Postop Assessment: no apparent nausea or vomiting Anesthetic complications: no   There were no known notable events for this encounter.  Last Vitals:  Vitals:   11/10/21 1000 11/10/21 1019  BP: 106/74 116/80  Pulse: 66 67  Resp: 20 19  Temp: 36.4 C   SpO2: 96% 94%    Last Pain:  Vitals:   11/10/21 1016  TempSrc:   PainSc: 0-No pain                 Raymondo Garcialopez,E. Monterio Bob

## 2021-11-10 NOTE — Transfer of Care (Signed)
Immediate Anesthesia Transfer of Care Note  Patient: Christian Jennings  Procedure(s) Performed: ATRIAL FIBRILLATION ABLATION  Patient Location: Cath Lab  Anesthesia Type:General  Level of Consciousness: awake, drowsy and patient cooperative  Airway & Oxygen Therapy: Patient Spontanous Breathing and Patient connected to nasal cannula oxygen  Post-op Assessment: Report given to RN, Post -op Vital signs reviewed and stable and Patient moving all extremities X 4  Post vital signs: Reviewed and stable  Last Vitals:  Vitals Value Taken Time  BP 134/82 11/10/21 0926  Temp 36.3 C 11/10/21 0927  Pulse 68 11/10/21 0927  Resp 20 11/10/21 0927  SpO2 97 % 11/10/21 0927  Vitals shown include unvalidated device data.  Last Pain:  Vitals:   11/10/21 0927  TempSrc: Temporal  PainSc: Asleep         Complications: There were no known notable events for this encounter.

## 2021-11-10 NOTE — H&P (Signed)
Electrophysiology Office Note:     Date:  11/10/2021    ID:  Christian Jennings, DOB [birth year], MRN [id number]   PCP:  London Pepper, MD   [facility] Cardiologist:  None  [facility] Electrophysiologist:  None    Referring MD: London Pepper, MD    Chief Complaint: New patient consult for Afib ablation   History of Present Illness:     Christian Jennings is a 71 y.o. male who presents for an evaluation for Afib ablation at the request of Dr. Percival Spanish. Their medical history includes paroxysmal atrial fibrillation, CVA 10/2015 in the setting of new onset atrial fibrillation (started on Eliquis), hypertension, hyperlipidemia,   He last saw Dr. Percival Spanish on 04/16/2021. Due to low blood pressure his HCTZ and lisinopril were discontinued and he was started on low-dose beta-blocker. At that visit he was back in sinus rhythm; he did not know he was in Afib at his previous PCP appointment. Due to not feeling well previously, he did not want to continue on the beta-blocker. Metoprolol was stopped and he was restarted on lisinopril.    On 05/19/2021 a call from Dr. Darien Ramus office reported that he was back in Afib at a recent visit with intermittent HR above 100 bpm. He was going to take 1/2 a tablet of metoprolol 25 mg twice a day. He was referred to EP for consideration of ablation.   He is accompanied by a family member. He confirms having atrial fibrillation for at least the past 5 years, since the time of his stroke.   By EKG today he is in NSR. However, they note that he felt like he may have been in Afib earlier today. His episodes are sporadic; may occur early in the morning for about an hour.   No bleeding issues while on Eliquis, although he does note minor bruising on his hands.   He denies any chest pain, shortness of breath, or peripheral edema. No lightheadedness, headaches, syncope, orthopnea, or PND.   Today he presents for PVI.      Objective      Past Medical History:  Diagnosis  Date   CVA (cerebral vascular accident) ([place])      a. 10/2015 occurring in the setting of new-onset untreated atrial fibrillation. Started on Eliquis.   HLD (hyperlipidemia)     HTN (hypertension)     PAF (paroxysmal atrial fibrillation) ([place])      a. on Eliquis following CVA in 10/2015.           Past Surgical History:  Procedure Laterality Date   EYE SURGERY        As child for muscle issue   EYE SURGERY        Cosmetic for drooping eyelid      Current Medications: Active Medications      Current Meds  Medication Sig   apixaban (ELIQUIS) 5 MG TABS tablet Take 1 tablet (5 mg total) by mouth 2 (two) times daily.   atorvastatin (LIPITOR) 20 MG tablet 1 tablet   levothyroxine (SYNTHROID) 25 MCG tablet Take by mouth.   lisinopril (ZESTRIL) 20 MG tablet Take 1 tablet (20 mg total) by mouth daily.   metoprolol tartrate (LOPRESSOR) 25 MG tablet Take 25 mg by mouth 2 (two) times daily.   traZODone (DESYREL) 100 MG tablet Take 2 tablets by mouth at bedtime.        Allergies:   Patient has no known allergies.    Social History  Socioeconomic History   Marital status: Married      Spouse name: Not on file   Number of children: Not on file   Years of education: Not on file   Highest education level: Not on file  Occupational History   Not on file  Tobacco Use   Smoking status: Never   Smokeless tobacco: Never  Substance and Sexual Activity   Alcohol use: No   Drug use: Not on file   Sexual activity: Yes      Birth control/protection: None  Other Topics Concern   Not on file  Social History Narrative   Not on file    Social Determinants of Health    Financial Resource Strain: Not on file  Food Insecurity: Not on file  Transportation Needs: Not on file  Physical Activity: Not on file  Stress: Not on file  Social Connections: Not on file      Family History: The patient's family history includes Alcoholism in his father; COPD in his father; Heart attack  in his maternal uncle; Throat cancer in his brother; Transient ischemic attack in his mother.   ROS:   Please see the history of present illness.    (+) Ecchymosis of bilateral hands All other systems reviewed and are negative.   EKGs/Labs/Other Studies Reviewed:     The following studies were reviewed today:   01/26/2016  Renal Artery Doppler FINDINGS: Right Kidney:   Length: 11.5 cm. Echogenicity within normal limits. No mass or hydronephrosis visualized.   Left Kidney:   Length: 11.7 cm. Echogenicity within normal limits. No mass or hydronephrosis visualized.   Bladder: Normal degree of distension. Bilateral ureteral jets are noted.   RENAL DUPLEX ULTRASOUND   Right Renal Artery Velocities:   Origin:  157 cm/sec   Mid:  129 cm/sec   Hilum:  104 cm/sec   Interlobar:  56 cm/sec   Arcuate:  27 Cm/sec   Left Renal Artery Velocities:   Origin:  263 cm/sec   Mid:  169 cm/sec   Hilum:  178 cm/sec   Interlobar:  33 cm/sec   Arcuate:  20 cm/sec   Aortic Velocity:  65 Cm/sec   Right Renal-Aortic Ratios:   Origin: 2.42   Mid:  1.99   Hilum: 1.60   Interlobar: 0.87   Arcuate: 0.42   Left Renal-Aortic Ratios:   Origin: 4.05   Mid: 2.60   Hilum: 2.74   Interlobar: 0.51   Arcuate: 0.51   IMPRESSION: No hydronephrosis or renal obstruction is noted. Kidneys appear to be normal in size and appearance.   Elevated peak systolic velocity is noted at origin of left renal artery consistent with 60% or greater stenotic lesion. CT angiography of the renal arteries is recommended for further evaluation.   11/16/2015  Echo TTE Study Conclusions   - Left ventricle: The cavity size was normal. Wall thickness was    normal. Systolic function was vigorous. The estimated ejection    fraction was in the range of 65% to 70%. The study was not    technically sufficient to allow evaluation of LV diastolic    dysfunction due to atrial fibrillation.  -  Aortic valve: There was trivial regurgitation. Valve area (VTI):    2.7 cm^2. Valve area (Vmax): 2.46 cm^2.  - Left atrium: The atrium was mildly dilated.  - Technically adequate study.    11/15/2015  Bilateral Carotid Doppler Summary:  Right: intimal wall thickening CCA. Mild mixed plaque origin ICA.  Left:  intimal wall thickening CCA. Mild soft plaque origin ICA.  Bilateral: 1-39% ICA plaquing. Vertebral artery flow is antegrade.      EKG:   EKG is personally reviewed.  07/07/2021: Sinus rhythm.     Recent Labs: No results found for requested labs within last 8760 hours.    Recent Lipid Panel Labs (Brief)          Component Value Date/Time    CHOL 105 02/20/2016 1013    TRIG 50 02/20/2016 1013    HDL 56 02/20/2016 1013    CHOLHDL 1.9 02/20/2016 1013    VLDL 10 02/20/2016 1013    LDLCALC 39 02/20/2016 1013        Physical Exam:     VS:  BP 180/112   Pulse 126   Ht '5\' 4"'$  (1.626 m)   Wt 141 lb 3.2 oz (64 kg)   SpO2 97%   BMI 24.24 kg/m         Wt Readings from Last 3 Encounters:  07/07/21 141 lb 3.2 oz (64 kg)  04/16/21 144 lb 9.6 oz (65.6 kg)  06/10/20 148 lb (67.1 kg)      GEN: Well nourished, well developed in no acute distress HEENT: Normal NECK: No JVD; No carotid bruits LYMPHATICS: No lymphadenopathy CARDIAC: RRR, no murmurs, rubs, gallops RESPIRATORY:  Clear to auscultation without rales, wheezing or rhonchi  ABDOMEN: Soft, non-tender, non-distended MUSCULOSKELETAL:  No edema; No deformity  SKIN: Warm and dry NEUROLOGIC:  Alert and oriented x 3 PSYCHIATRIC:  Normal affect          Assessment ASSESSMENT:     1. PAF (paroxysmal atrial fibrillation) ([place])   2. Essential hypertension   3. Hyperlipidemia, unspecified hyperlipidemia type     PLAN:     In order of problems listed above:   #Paroxysmal atrial fibrillation Symptomatic.  Associated with hypotensive episodes.  I discussed treatment strategies with the patient today including  antiarrhythmic drug therapy and catheter ablation.  I discussed the pros and cons of each strategy.  I discussed the procedural details of catheter ablation including the risks, recovery and likelihood of success.  He takes Eliquis for stroke prophylaxis.  He would like some time to think about his options before letting us know how he would like to proceed which I think is very reasonable.   Discussed treatment options today for his AF including antiarrhythmic drug therapy and ablation. Discussed risks, recovery and likelihood of success. Discussed potential need for repeat ablation procedures and antiarrhythmic drugs after an initial ablation. .   Risk, benefits, and alternatives to EP study and radiofrequency ablation for afib were also discussed in detail today. These risks include but are not limited to stroke, bleeding, vascular damage, tamponade, perforation, damage to the esophagus, lungs, and other structures, pulmonary vein stenosis, worsening renal function, and death.Carto, ICE, anesthesia are requested for the procedure.  Will also obtain CT PV protocol prior to the procedure to exclude LAA thrombus and further evaluate atrial anatomy.   #Hypertension Above goal today.  Continue monitoring blood pressures at home 1-2 times per week and write these values down.  This should be brought to your primary care physician for further medication titration.    Today he presents for PVI. Procedure reviewed.     Lysbeth Galas T. Quentin Ore, MD, [facility], [facility] 11/10/2021 Electrophysiology [facility]

## 2021-11-10 NOTE — Discharge Instructions (Addendum)
Post procedure care instructions No driving for 4 days. No lifting over 5 lbs for 1 week. No vigorous or sexual activity for 1 week. You may return to work/your usual activities on 10/19/21. Keep procedure site clean & dry. If you notice increased pain, swelling, bleeding or pus, call/return!  You may shower after 24 hours, but no soaking in baths/hot tubs/pools for 1 week.    You have an appointment set up with the [facility].  Multiple studies have shown that being followed by a dedicated atrial fibrillation clinic in addition to the standard care you receive from your other physicians improves health. We believe that enrollment in the atrial fibrillation clinic will allow [place] to better care for you.   The phone number to the [facility] is [phone]. The clinic is staffed Monday through Friday from 8:30am to 5pm.  Parking Directions: The clinic is located in the Heart and Vascular Building connected to [facility]. 1)From [address] turn on to [address] and go to the 3rd entrance  (Heart and Vascular entrance) on the right. 2)Look to the right for Heart &Vascular Parking Garage. 3)A code for the entrance is required, for October is 1504.   4)Take the elevators to the 1st floor. Registration is in the room with the glass walls at the end of the hallway.  If you have any trouble parking or locating the clinic, please don't hesitate to call [phone].  Cardiac Ablation, Care After  This sheet gives you information about how to care for yourself after your procedure. Your health care provider may also give you more specific instructions. If you have problems or questions, contact your health care provider. What can I expect after the procedure? After the procedure, it is common to have: Bruising around your puncture site. Tenderness around your puncture site. Skipped heartbeats. Tiredness (fatigue).  Follow these instructions at  home: Puncture site care  Follow instructions from your health care provider about how to take care of your puncture site. Make sure you: If present, leave stitches (sutures), skin glue, or adhesive strips in place. These skin closures may need to stay in place for up to 2 weeks. If adhesive strip edges start to loosen and curl up, you may trim the loose edges. Do not remove adhesive strips completely unless your health care provider tells you to do that. If a large square bandage is present, this may be removed 24 hours after surgery.  Check your puncture site every day for signs of infection. Check for: Redness, swelling, or pain. Fluid or blood. If your puncture site starts to bleed, lie down on your back, apply firm pressure to the area, and contact your health care provider. Warmth. Pus or a bad smell. A pea or small marble sized lump at the site is normal and can take up to three months to resolve.  Driving Do not drive for at least 4 days after your procedure or however long your health care provider recommends. (Do not resume driving if you have previously been instructed not to drive for other health reasons.) Do not drive or use heavy machinery while taking prescription pain medicine. Activity Avoid activities that take a lot of effort for at least 7 days after your procedure. Do not lift anything that is heavier than 5 lb (4.5 kg) for one week.  No sexual activity for 1 week.  Return to your normal activities as told by your health care provider. Ask your health care  provider what activities are safe for you. General instructions Take over-the-counter and prescription medicines only as told by your health care provider. Do not use any products that contain nicotine or tobacco, such as cigarettes and e-cigarettes. If you need help quitting, ask your health care provider. You may shower after 24 hours, but Do not take baths, swim, or use a hot tub for 1 week.  Do not drink alcohol for  24 hours after your procedure. Keep all follow-up visits as told by your health care provider. This is important. Contact a health care provider if: You have redness, mild swelling, or pain around your puncture site. You have fluid or blood coming from your puncture site that stops after applying firm pressure to the area. Your puncture site feels warm to the touch. You have pus or a bad smell coming from your puncture site. You have a fever. You have chest pain or discomfort that spreads to your neck, jaw, or arm. You are sweating a lot. You feel nauseous. You have a fast or irregular heartbeat. You have shortness of breath. You are dizzy or light-headed and feel the need to lie down. You have pain or numbness in the arm or leg closest to your puncture site. Get help right away if: Your puncture site suddenly swells. Your puncture site is bleeding and the bleeding does not stop after applying firm pressure to the area. These symptoms may represent a serious problem that is an emergency. Do not wait to see if the symptoms will go away. Get medical help right away. Call your local emergency services (911 in the [place] Do not drive yourself to the hospital. Summary After the procedure, it is normal to have bruising and tenderness at the puncture site in your groin, neck, or forearm. Check your puncture site every day for signs of infection. Get help right away if your puncture site is bleeding and the bleeding does not stop after applying firm pressure to the area. This is a medical emergency. This information is not intended to replace advice given to you by your health care provider. Make sure you discuss any questions you have with your health care provider. Cardiac Ablation, Care After  This sheet gives you information about how to care for yourself after your procedure. Your health care provider may also give you more specific instructions. If you have problems or questions, contact your  health care provider. What can I expect after the procedure? After the procedure, it is common to have: Bruising around your puncture site. Tenderness around your puncture site. Skipped heartbeats. Tiredness (fatigue).  Follow these instructions at home: Puncture site care  Follow instructions from your health care provider about how to take care of your puncture site. Make sure you: If present, leave stitches (sutures), skin glue, or adhesive strips in place. These skin closures may need to stay in place for up to 2 weeks. If adhesive strip edges start to loosen and curl up, you may trim the loose edges. Do not remove adhesive strips completely unless your health care provider tells you to do that. If a large square bandage is present, this may be removed 24 hours after surgery.  Check your puncture site every day for signs of infection. Check for: Redness, swelling, or pain. Fluid or blood. If your puncture site starts to bleed, lie down on your back, apply firm pressure to the area, and contact your health care provider. Warmth. Pus or a bad smell. A pea or  small marble sized lump at the site is normal and can take up to three months to resolve.  Driving Do not drive for at least 4 days after your procedure or however long your health care provider recommends. (Do not resume driving if you have previously been instructed not to drive for other health reasons.) Do not drive or use heavy machinery while taking prescription pain medicine. Activity Avoid activities that take a lot of effort for at least 7 days after your procedure. Do not lift anything that is heavier than 5 lb (4.5 kg) for one week.  No sexual activity for 1 week.  Return to your normal activities as told by your health care provider. Ask your health care provider what activities are safe for you. General instructions Take over-the-counter and prescription medicines only as told by your health care provider. Do not use  any products that contain nicotine or tobacco, such as cigarettes and e-cigarettes. If you need help quitting, ask your health care provider. You may shower after 24 hours, but Do not take baths, swim, or use a hot tub for 1 week.  Do not drink alcohol for 24 hours after your procedure. Keep all follow-up visits as told by your health care provider. This is important. Contact a health care provider if: You have redness, mild swelling, or pain around your puncture site. You have fluid or blood coming from your puncture site that stops after applying firm pressure to the area. Your puncture site feels warm to the touch. You have pus or a bad smell coming from your puncture site. You have a fever. You have chest pain or discomfort that spreads to your neck, jaw, or arm. You are sweating a lot. You feel nauseous. You have a fast or irregular heartbeat. You have shortness of breath. You are dizzy or light-headed and feel the need to lie down. You have pain or numbness in the arm or leg closest to your puncture site. Get help right away if: Your puncture site suddenly swells. Your puncture site is bleeding and the bleeding does not stop after applying firm pressure to the area. These symptoms may represent a serious problem that is an emergency. Do not wait to see if the symptoms will go away. Get medical help right away. Call your local emergency services (911 in the [place] Do not drive yourself to the hospital. Summary After the procedure, it is normal to have bruising and tenderness at the puncture site in your groin, neck, or forearm. Check your puncture site every day for signs of infection. Get help right away if your puncture site is bleeding and the bleeding does not stop after applying firm pressure to the area. This is a medical emergency. This information is not intended to replace advice given to you by your health care provider. Make sure you discuss any questions you have with your  health care provider.

## 2021-11-11 ENCOUNTER — Encounter (HOSPITAL_COMMUNITY): Payer: Self-pay | Admitting: Cardiology

## 2021-11-30 ENCOUNTER — Encounter (HOSPITAL_COMMUNITY): Payer: Self-pay

## 2021-12-08 ENCOUNTER — Ambulatory Visit (HOSPITAL_COMMUNITY): Payer: Medicare PPO | Admitting: Physician Assistant

## 2021-12-11 ENCOUNTER — Ambulatory Visit (HOSPITAL_COMMUNITY)
Admission: RE | Admit: 2021-12-11 | Discharge: 2021-12-11 | Disposition: A | Discharge status: Home / Self Care | Payer: Medicare PPO | Source: Ambulatory Visit | Attending: Physician Assistant | Admitting: Physician Assistant

## 2021-12-11 ENCOUNTER — Encounter (HOSPITAL_COMMUNITY): Payer: Self-pay | Admitting: Physician Assistant

## 2021-12-11 VITALS — BP 164/100 | HR 55 | Ht 64.0 in | Wt 142.0 lb

## 2021-12-11 DIAGNOSIS — I119 Hypertensive heart disease without heart failure: Secondary | ICD-10-CM | POA: Insufficient documentation

## 2021-12-11 DIAGNOSIS — Z79899 Other long term (current) drug therapy: Secondary | ICD-10-CM | POA: Insufficient documentation

## 2021-12-11 DIAGNOSIS — Z7901 Long term (current) use of anticoagulants: Secondary | ICD-10-CM | POA: Diagnosis not present

## 2021-12-11 DIAGNOSIS — D6869 Other thrombophilia: Secondary | ICD-10-CM | POA: Diagnosis not present

## 2021-12-11 DIAGNOSIS — Z8673 Personal history of transient ischemic attack (TIA), and cerebral infarction without residual deficits: Secondary | ICD-10-CM | POA: Diagnosis not present

## 2021-12-11 DIAGNOSIS — I480 Paroxysmal atrial fibrillation: Secondary | ICD-10-CM | POA: Diagnosis not present

## 2021-12-11 DIAGNOSIS — E785 Hyperlipidemia, unspecified: Secondary | ICD-10-CM | POA: Insufficient documentation

## 2021-12-11 NOTE — Progress Notes (Signed)
Primary Care Physician: London Pepper, MD Primary Cardiologist: Dr Percival Spanish Primary Electrophysiologist: Dr Quentin Ore Referring Physician: Dr Laqueta Linden is a 71 y.o. male with a history of HTN, CVA, HLD, atrial fibrillation who presents for follow up in the [facility].  The patient was initially diagnosed with atrial fibrillation in 2017. Patient is on Eliquis for a CHADS2VASC score of 4. He had recurrent atrial fibrillation and underwent afib ablation with Dr Quentin Ore on 11/10/21. He reports that he has not had any known episodes of afib since the ablation. He checks his pulse daily. He denies chest pain, swallowing pain, or breathing issues.   Today, he denies symptoms of palpitations, chest pain, shortness of breath, orthopnea, PND, lower extremity edema, dizziness, presyncope, syncope, snoring, daytime somnolence, bleeding, or neurologic sequela. The patient is tolerating medications without difficulties and is otherwise without complaint today.    Atrial Fibrillation Risk Factors:  he does not have symptoms or diagnosis of sleep apnea. he does not have a history of rheumatic fever.   he has a BMI of Body mass index is 24.37 kg/m.Marland Kitchen Filed Weights   12/11/21 1058  Weight: 64.4 kg    Family History  Problem Relation Age of Onset   Transient ischemic attack Mother    Alcoholism Father    COPD Father    Throat cancer Brother        Or esophageal   Heart attack Maternal Uncle      Atrial Fibrillation Management history:  Previous antiarrhythmic drugs: none Previous cardioversions: none Previous ablations: 11/10/21 CHADS2VASC score: 4 Anticoagulation history: Eliquis   Past Medical History:  Diagnosis Date   CVA (cerebral vascular accident) ([place])    a. 10/2015 occurring in the setting of new-onset untreated atrial fibrillation. Started on Eliquis.   HLD (hyperlipidemia)    HTN (hypertension)    PAF (paroxysmal atrial  fibrillation) ([place])    a. on Eliquis following CVA in 10/2015.   Past Surgical History:  Procedure Laterality Date   ATRIAL FIBRILLATION ABLATION N/A 11/10/2021   Procedure: ATRIAL FIBRILLATION ABLATION;  Surgeon: Vickie Epley, MD;  Location: [facility];  Service: Cardiovascular;  Laterality: N/A;   EYE SURGERY     As child for muscle issue   EYE SURGERY     Cosmetic for drooping eyelid    Current Outpatient Medications  Medication Sig Dispense Refill   acetaminophen (TYLENOL) 500 MG tablet Take 1,000 mg by mouth every 6 (six) hours as needed (pain.).     apixaban (ELIQUIS) 5 MG TABS tablet Take 1 tablet by mouth twice daily 180 tablet 1   atorvastatin (LIPITOR) 20 MG tablet Take 20 mg by mouth every evening.     levothyroxine (SYNTHROID) 25 MCG tablet Take 25 mcg by mouth daily before breakfast.     lisinopril (ZESTRIL) 20 MG tablet Take 1 tablet (20 mg total) by mouth daily. 90 tablet 3   metoprolol tartrate (LOPRESSOR) 25 MG tablet Take 25 mg by mouth 2 (two) times daily.     pantoprazole (PROTONIX) 40 MG tablet Take 1 tablet (40 mg total) by mouth daily. 45 tablet 0   traZODone (DESYREL) 100 MG tablet Take 200 mg by mouth at bedtime.     colchicine 0.6 MG tablet Take 1 tablet (0.6 mg total) by mouth 2 (two) times daily for 5 days. 10 tablet 0   No current facility-administered medications for this encounter.    No Known Allergies  Social History   Socioeconomic History   Marital status: Married    Spouse name: Not on file   Number of children: Not on file   Years of education: Not on file   Highest education level: Not on file  Occupational History   Not on file  Tobacco Use   Smoking status: Never   Smokeless tobacco: Never   Tobacco comments:    Never smoke 12/11/21  Substance and Sexual Activity   Alcohol use: No   Drug use: Not on file   Sexual activity: Yes    Birth control/protection: None  Other Topics Concern   Not on file  Social History  Narrative   Not on file   Social Determinants of Health   Financial Resource Strain: Not on file  Food Insecurity: Not on file  Transportation Needs: Not on file  Physical Activity: Not on file  Stress: Not on file  Social Connections: Not on file  Intimate Partner Violence: Not on file     ROS- All systems are reviewed and negative except as per the HPI above.  Physical Exam: Vitals:   12/11/21 1058  BP: (!) 164/100  Pulse: (!) 55  Weight: 64.4 kg  Height: '5\' 4"'$  (1.626 m)    GEN- The patient is a well appearing male, alert and oriented x 3 today.   Head- normocephalic, atraumatic Eyes-  Sclera clear, conjunctiva pink Ears- hearing intact Oropharynx- clear Neck- supple  Lungs- Clear to ausculation bilaterally, normal work of breathing Heart- Regular rate and rhythm, no murmurs, rubs or gallops  GI- soft, NT, ND, + BS Extremities- no clubbing, cyanosis, or edema MS- no significant deformity or atrophy Skin- no rash or lesion Psych- euthymic mood, full affect Neuro- strength and sensation are intact  Wt Readings from Last 3 Encounters:  12/11/21 64.4 kg  11/10/21 63.5 kg  07/07/21 64 kg    EKG today demonstrates  SB Vent. rate 55 BPM PR interval 158 ms QRS duration 92 ms QT/QTcB 432/413 ms  Echo 11/16/15 demonstrated  Left ventricle: The cavity size was normal. Wall thickness was    normal. Systolic function was vigorous. The estimated ejection    fraction was in the range of 65% to 70%. The study was not    technically sufficient to allow evaluation of LV diastolic    dysfunction due to atrial fibrillation.  - Aortic valve: There was trivial regurgitation. Valve area (VTI):    2.7 cm^2. Valve area (Vmax): 2.46 cm^2.  - Left atrium: The atrium was mildly dilated.  - Technically adequate study.   Epic records are reviewed at length today  CHA2DS2-VASc Score = 4  The patient's score is based upon: CHF History: 0 HTN History: 1 Diabetes History:  0 Stroke History: 2 Vascular Disease History: 0 Age Score: 1 Gender Score: 0       ASSESSMENT AND PLAN: 1. Paroxysmal Atrial Fibrillation (ICD10:  I48.0) The patient's CHA2DS2-VASc score is 4, indicating a 4.8% annual risk of stroke.   S/p afib ablation 11/10/21 Patient appears to be maintaining SR. Continue Eliquis 5 mg BID with no missed doses for 3 months post ablation.  Continue Lopressor 25 mg BID  2. Secondary Hypercoagulable State (ICD10:  D68.69) The patient is at significant risk for stroke/thromboembolism based upon his CHA2DS2-VASc Score of 4.  Continue Apixaban (Eliquis).   3. HTN Patient reports h/o white coat HTN Typically better controlled at home Will not make changes today.   Follow up with Dr  Quentin Ore as scheduled.    [facility] [address] [place], [place] [address] [phone] 12/11/2021 12:12 PM

## 2022-01-05 DIAGNOSIS — I4891 Unspecified atrial fibrillation: Secondary | ICD-10-CM | POA: Diagnosis not present

## 2022-01-05 DIAGNOSIS — E785 Hyperlipidemia, unspecified: Secondary | ICD-10-CM | POA: Diagnosis not present

## 2022-01-05 DIAGNOSIS — I10 Essential (primary) hypertension: Secondary | ICD-10-CM | POA: Diagnosis not present

## 2022-01-05 DIAGNOSIS — Z1211 Encounter for screening for malignant neoplasm of colon: Secondary | ICD-10-CM | POA: Diagnosis not present

## 2022-01-05 DIAGNOSIS — E039 Hypothyroidism, unspecified: Secondary | ICD-10-CM | POA: Diagnosis not present

## 2022-02-10 ENCOUNTER — Ambulatory Visit: Payer: Medicare PPO | Admitting: Cardiology

## 2022-03-26 ENCOUNTER — Ambulatory Visit: Payer: Medicare PPO | Attending: Cardiology | Admitting: Cardiology

## 2022-03-26 ENCOUNTER — Encounter: Payer: Self-pay | Admitting: Cardiology

## 2022-03-26 VITALS — BP 178/102 | HR 65 | Ht 64.0 in | Wt 146.0 lb

## 2022-03-26 DIAGNOSIS — I10 Essential (primary) hypertension: Secondary | ICD-10-CM | POA: Diagnosis not present

## 2022-03-26 DIAGNOSIS — I480 Paroxysmal atrial fibrillation: Secondary | ICD-10-CM | POA: Diagnosis not present

## 2022-03-26 NOTE — Patient Instructions (Signed)
Medication Instructions:  Your physician recommends that you continue on your current medications as directed. Please refer to the Current Medication list given to you today.  *If you need a refill on your cardiac medications before your next appointment, please call your pharmacy*  Follow-Up: At [facility], you and your health needs are our priority.  As part of our continuing mission to provide you with exceptional heart care, we have created designated Provider Care Teams.  These Care Teams include your primary Cardiologist (physician) and Advanced Practice Providers (APPs -  Physician Assistants and Nurse Practitioners) who all work together to provide you with the care you need, when you need it.  Your next appointment:   1 year(s)  Provider:   You will see one of the following Advanced Practice Providers on your designated Care Team:   Tommye Standard, [place]" [place], [place], NP

## 2022-03-26 NOTE — Progress Notes (Signed)
Electrophysiology Office Follow up Visit Note:    Date:  03/26/2022   ID:  Christian Jennings, DOB 1950/08/08, MRN [id number]  PCP:  London Pepper, MD  [facility] Cardiologist:  None  [facility] Electrophysiologist:  Vickie Epley, MD    Interval History:    Christian Jennings is a 72 y.o. male who presents for a follow up visit. They were last seen in clinic 07/07/2021. He was referred for consideration of ablation. Since his last appointment, he had a PVI on 11/10/2021.   Today, he is feeling overall well. He has been monitoring his heart rate at home and reports at rest it averages around 65 bpm. He notes that his blood pressure tends to fluctuate and is higher when in clinic. At home he gets readings in the 140s/60s. He is agrees to begin taking a log of his blood pressures at home.   He recalled when he had a stroke about 4 years ago, he learned that he was in A-fib.   He has not been staying active, due to a hernia.   He denies any palpitations, chest pain, shortness of breath, or peripheral edema. No lightheadedness, headaches, syncope, orthopnea, or PND.  Past Medical History:  Diagnosis Date   CVA (cerebral vascular accident) ([place])    a. 10/2015 occurring in the setting of new-onset untreated atrial fibrillation. Started on Eliquis.   HLD (hyperlipidemia)    HTN (hypertension)    PAF (paroxysmal atrial fibrillation) ([place])    a. on Eliquis following CVA in 10/2015.    Past Surgical History:  Procedure Laterality Date   ATRIAL FIBRILLATION ABLATION N/A 11/10/2021   Procedure: ATRIAL FIBRILLATION ABLATION;  Surgeon: Vickie Epley, MD;  Location: [facility];  Service: Cardiovascular;  Laterality: N/A;   EYE SURGERY     As child for muscle issue   EYE SURGERY     Cosmetic for drooping eyelid    Current Medications: Current Meds  Medication Sig   acetaminophen (TYLENOL) 500 MG tablet Take 1,000 mg by mouth every 6 (six) hours as needed (pain.).   apixaban  (ELIQUIS) 5 MG TABS tablet Take 1 tablet by mouth twice daily   atorvastatin (LIPITOR) 20 MG tablet Take 20 mg by mouth every evening.   levothyroxine (SYNTHROID) 25 MCG tablet Take 25 mcg by mouth daily before breakfast.   lisinopril (ZESTRIL) 20 MG tablet Take 1 tablet (20 mg total) by mouth daily.   metoprolol tartrate (LOPRESSOR) 25 MG tablet Take 25 mg by mouth 2 (two) times daily.   traZODone (DESYREL) 100 MG tablet Take 200 mg by mouth at bedtime.     Allergies:   Patient has no known allergies.   Social History   Socioeconomic History   Marital status: Married    Spouse name: Not on file   Number of children: Not on file   Years of education: Not on file   Highest education level: Not on file  Occupational History   Not on file  Tobacco Use   Smoking status: Never   Smokeless tobacco: Never   Tobacco comments:    Never smoke 12/11/21  Substance and Sexual Activity   Alcohol use: No   Drug use: Not on file   Sexual activity: Yes    Birth control/protection: None  Other Topics Concern   Not on file  Social History Narrative   Not on file   Social Determinants of Health   Financial Resource Strain: Not on file  Food Insecurity: Not on file  Transportation Needs: Not on file  Physical Activity: Not on file  Stress: Not on file  Social Connections: Not on file     Family History: The patient's family history includes Alcoholism in his father; COPD in his father; Heart attack in his maternal uncle; Throat cancer in his brother; Transient ischemic attack in his mother.  ROS:   Please see the history of present illness.     All other systems reviewed and are negative.  EKGs/Labs/Other Studies Reviewed:    The following studies were reviewed today:   EKG:  The EKG ordered today demonstrates sinus rhythm  Recent Labs: 10/22/2021: BUN 21; Creatinine, Ser 1.07; Hemoglobin 15.3; Platelets 141; Potassium 4.3; Sodium 140   Recent Lipid Panel    Component Value  Date/Time   CHOL 105 02/20/2016 1013   TRIG 50 02/20/2016 1013   HDL 56 02/20/2016 1013   CHOLHDL 1.9 02/20/2016 1013   VLDL 10 02/20/2016 1013   LDLCALC 39 02/20/2016 1013    Physical Exam:    VS:  BP (!) 178/102   Pulse 65   Ht 5' 4"$  (1.626 m)   Wt 146 lb (66.2 kg)   SpO2 98%   BMI 25.06 kg/m     Wt Readings from Last 3 Encounters:  03/26/22 146 lb (66.2 kg)  12/11/21 142 lb (64.4 kg)  11/10/21 140 lb (63.5 kg)     GEN: Well nourished, well developed in no acute distress CARDIAC: RRR, no murmurs, rubs, gallops PSYCHIATRIC:  Normal affect        ASSESSMENT:    No diagnosis found. PLAN:    In order of problems listed above:  #pAF Doing well after ablation. No recurrence. Continue eliquis.  #Hypertension Above goal today.  Recommend checking blood pressures 1-2 times per week at home and recording the values.  Recommend bringing these recordings to the primary care physician.  Follow up 1 year with APP.    Medication Adjustments/Labs and Tests Ordered: Current medicines are reviewed at length with the patient today.  Concerns regarding medicines are outlined above.   No orders of the defined types were placed in this encounter.  No orders of the defined types were placed in this encounter.  I,Rachel Rivera,acting as a scribe for Vickie Epley, MD.,have documented all relevant documentation on the behalf of Vickie Epley, MD,as directed by  Vickie Epley, MD while in the presence of Vickie Epley, MD.  I, Vickie Epley, MD, have reviewed all documentation for this visit. The documentation on 03/26/22 for the exam, diagnosis, procedures, and orders are all accurate and complete.   Signed, Lars Mage, MD, [facility], [facility] 03/26/2022 3:38 PM    Electrophysiology [facility]

## 2022-04-07 ENCOUNTER — Other Ambulatory Visit: Payer: Self-pay | Admitting: Cardiology

## 2022-04-07 DIAGNOSIS — I480 Paroxysmal atrial fibrillation: Secondary | ICD-10-CM

## 2022-04-07 NOTE — Telephone Encounter (Signed)
Prescription refill request for Eliquis received. Indication: Afib  Last office visit: 03/26/22 Quentin Ore)  Scr: 1.07 (10/22/21)  Age: 71 Weight: 66.2kg  Appropriate dose. Refill sent

## 2022-06-07 ENCOUNTER — Telehealth: Payer: Self-pay | Admitting: *Deleted

## 2022-06-07 ENCOUNTER — Other Ambulatory Visit: Payer: Self-pay | Admitting: Surgery

## 2022-06-07 DIAGNOSIS — K4090 Unilateral inguinal hernia, without obstruction or gangrene, not specified as recurrent: Secondary | ICD-10-CM | POA: Diagnosis not present

## 2022-06-07 NOTE — Telephone Encounter (Signed)
   Pre-operative Risk Assessment    Patient Name: Christian Jennings  DOB: [birth year] MRN: [id number]      Request for Surgical Clearance    Procedure:   Open Right Inguinal Hernia Repair with Mesh Surgery  Date of Surgery:  Clearance TBD                                 Surgeon:  Dr. [name] Surgeon's Group or Practice Name:  [facility] Phone number:  [phone] Fax number:  [phone]   Type of Clearance Requested:   - Medical  - Pharmacy:  Hold Apixaban (Eliquis) Not Indicated.   Type of Anesthesia:   LMA and Tap Block   Additional requests/questions:  Pt seen Dr. Lalla Brothers on February 9,2024  Signed, Emmit Pomfret   06/07/2022, 3:56 PM

## 2022-06-08 ENCOUNTER — Telehealth: Payer: Self-pay | Admitting: *Deleted

## 2022-06-08 NOTE — Telephone Encounter (Signed)
Primary Cardiologist:None   Preoperative team, please contact this patient and set up a phone call appointment for further preoperative risk assessment. Please obtain consent and complete medication review. Thank you for your help.   I confirm that guidance regarding antiplatelet and oral anticoagulation therapy has been completed and, if necessary, noted below.   Levi Aland, NP-C  06/08/2022, 8:22 AM [address] Office [phone] Fax [phone]

## 2022-06-08 NOTE — Telephone Encounter (Signed)
Patient with diagnosis of afib on Eliquis for anticoagulation.    Procedure: Open Right Inguinal Hernia Repair with Mesh Surgery  Date of procedure: TBD   CHA2DS2-VASc Score = 4   This indicates a 4.8% annual risk of stroke. The patient's score is based upon: CHF History: 0 HTN History: 1 Diabetes History: 0 Stroke History: 2 Vascular Disease History: 0 Age Score: 1 Gender Score: 0      CrCl 52 ml/min  Of note, patient has hx of stroke prior to afib dx. He underwent afib ablation 6 months ago and has not had any reoccurrence.   Per office protocol, patient can hold Eliquis for 2 days prior to procedure.   He should resume as soon as safely possible per surgeon.  **This guidance is not considered finalized until pre-operative APP has relayed final recommendations.**

## 2022-06-08 NOTE — Telephone Encounter (Signed)
Pt has been scheduled for tele pre op appt 06/15/22 @ 2 pm. Med rec and consent are done.     Patient Consent for Virtual Visit        BRENTT Jennings has provided verbal consent on 06/08/2022 for a virtual visit (video or telephone).   CONSENT FOR VIRTUAL VISIT FOR:  Christian Jennings  By participating in this virtual visit I agree to the following:  I hereby voluntarily request, consent and authorize [facility] and its employed or contracted physicians, physician assistants, nurse practitioners or other licensed health care professionals (the Practitioner), to provide me with telemedicine health care services (the "Services") as deemed necessary by the treating Practitioner. I acknowledge and consent to receive the Services by the Practitioner via telemedicine. I understand that the telemedicine visit will involve communicating with the Practitioner through live audiovisual communication technology and the disclosure of certain medical information by electronic transmission. I acknowledge that I have been given the opportunity to request an in-person assessment or other available alternative prior to the telemedicine visit and am voluntarily participating in the telemedicine visit.  I understand that I have the right to withhold or withdraw my consent to the use of telemedicine in the course of my care at any time, without affecting my right to future care or treatment, and that the Practitioner or I may terminate the telemedicine visit at any time. I understand that I have the right to inspect all information obtained and/or recorded in the course of the telemedicine visit and may receive copies of available information for a reasonable fee.  I understand that some of the potential risks of receiving the Services via telemedicine include:  Delay or interruption in medical evaluation due to technological equipment failure or disruption; Information transmitted may not be sufficient (e.g.  poor resolution of images) to allow for appropriate medical decision making by the Practitioner; and/or  In rare instances, security protocols could fail, causing a breach of personal health information.  Furthermore, I acknowledge that it is my responsibility to provide information about my medical history, conditions and care that is complete and accurate to the best of my ability. I acknowledge that Practitioner's advice, recommendations, and/or decision may be based on factors not within their control, such as incomplete or inaccurate data provided by me or distortions of diagnostic images or specimens that may result from electronic transmissions. I understand that the practice of medicine is not an exact science and that Practitioner makes no warranties or guarantees regarding treatment outcomes. I acknowledge that a copy of this consent can be made available to me via my patient portal [facility] MyChart), or I can request a printed copy by calling the office of [facility].    I understand that my insurance will be billed for this visit.   I have read or had this consent read to me. I understand the contents of this consent, which adequately explains the benefits and risks of the Services being provided via telemedicine.  I have been provided ample opportunity to ask questions regarding this consent and the Services and have had my questions answered to my satisfaction. I give my informed consent for the services to be provided through the use of telemedicine in my medical care

## 2022-06-08 NOTE — Telephone Encounter (Signed)
Pt has been scheduled for tele pre op appt 06/15/22 @ 2 pm. Med rec and consent are done.

## 2022-06-13 NOTE — Progress Notes (Unsigned)
Virtual Visit via Telephone Note   Because of Christian Jennings's co-morbid illnesses, he is at least at moderate risk for complications without adequate follow up.  This format is felt to be most appropriate for this patient at this time.  The patient did not have access to video technology/had technical difficulties with video requiring transitioning to audio format only (telephone).  All issues noted in this document were discussed and addressed.  No physical exam could be performed with this format.  Please refer to the patient's chart for his consent to telehealth for [facility].  Evaluation Performed:  Preoperative cardiovascular risk assessment _____________   Date:  06/13/2022   Patient ID:  Christian Jennings, DOB Jan 23, 1951, MRN [id number] Patient Location:  Home Provider location:   Office  Primary Care Provider:  Farris Has, MD Primary Cardiologist:  None  Chief Complaint / Patient Profile   72 y.o. y/o male with a h/o PAF (on Eliquis), HTN, HLD, CVA 10/2015 who is pending open right inguinal hernia repair with mesh and presents today for telephonic preoperative cardiovascular risk assessment.  History of Present Illness    Christian Jennings is a 72 y.o. male who presents via audio/video conferencing for a telehealth visit today.  Pt was last seen in cardiology clinic on 03/26/2022 by Dr. Lalla Brothers.  At that time Christian Jennings was doing well with no new cardiac complaints but blood pressures were fluctuating and during visit was in the 140s over 60s.  The patient is now pending procedure as outlined above. Since his last visit, he reports that he has been doing well with no new cardiac complaints.  His blood pressure on 416 was 125/75 and during our visit today was 162/90.  He reports that he was checking his blood pressures primarily in the right arm.  We discussed techniques for checking blood pressure more accurately such as checking in the right arm with both feet flat on the  floor and no talking.  I advised him to continue to check his blood pressure and if numbers remain elevated to reach out to his surgeon regarding this.  Patient's blood pressures today? 4/16 125/75  162/90-today  He denies chest pain, shortness of breath, lower extremity edema, fatigue, palpitations, melena, hematuria, hemoptysis, diaphoresis, weakness, presyncope, syncope, orthopnea, and PND.    Per office protocol, patient can hold Eliquis for 2 days prior to procedure.   He should resume as soon as safely possible per surgeon.  Past Medical History    Past Medical History:  Diagnosis Date   CVA (cerebral vascular accident) (HCC)    a. 10/2015 occurring in the setting of new-onset untreated atrial fibrillation. Started on Eliquis.   HLD (hyperlipidemia)    HTN (hypertension)    PAF (paroxysmal atrial fibrillation) (HCC)    a. on Eliquis following CVA in 10/2015.   Past Surgical History:  Procedure Laterality Date   ATRIAL FIBRILLATION ABLATION N/A 11/10/2021   Procedure: ATRIAL FIBRILLATION ABLATION;  Surgeon: Lanier Prude, MD;  Location: [facility] INVASIVE CV LAB;  Service: Cardiovascular;  Laterality: N/A;   EYE SURGERY     As child for muscle issue   EYE SURGERY     Cosmetic for drooping eyelid    Allergies  No Known Allergies  Home Medications    Prior to Admission medications   Medication Sig Start Date End Date Taking? Authorizing Provider  acetaminophen (TYLENOL) 500 MG tablet Take 1,000 mg by mouth every 6 (six) hours as  needed (pain.).    [provider]  apixaban (ELIQUIS) 5 MG TABS tablet Take 1 tablet by mouth twice daily 04/07/22   Lanier Prude, MD  atorvastatin (LIPITOR) 20 MG tablet Take 20 mg by mouth every evening. 07/04/20   [provider]  colchicine 0.6 MG tablet Take 1 tablet (0.6 mg total) by mouth 2 (two) times daily for 5 days. Patient not taking: Reported on 06/08/2022 11/10/21 11/15/21  Sheilah Pigeon, PA-C  levothyroxine  (SYNTHROID) 25 MCG tablet Take 25 mcg by mouth daily before breakfast. 03/27/21   [provider]  lisinopril (ZESTRIL) 20 MG tablet Take 1 tablet (20 mg total) by mouth daily. 04/16/21 11/04/23  Rollene Rotunda, MD  metoprolol tartrate (LOPRESSOR) 25 MG tablet Take 25 mg by mouth 2 (two) times daily. 05/15/21   [provider]  pantoprazole (PROTONIX) 40 MG tablet Take 1 tablet (40 mg total) by mouth daily. Patient not taking: Reported on 06/08/2022 11/10/21 12/25/21  Sheilah Pigeon, PA-C  traZODone (DESYREL) 100 MG tablet Take 200 mg by mouth at bedtime. 03/23/21   [provider]    Physical Exam    Vital Signs:  Christian Jennings does not have vital signs available for review today.  160/90  Given telephonic nature of communication, physical exam is limited. AAOx3. NAD. Normal affect.  Speech and respirations are unlabored.  Accessory Clinical Findings    None  Assessment & Plan    1.  Preoperative Cardiovascular Risk Assessment:  -Patient is RCRI score is 6.6%  The patient affirms he has been doing well without any new cardiac symptoms. They are able to achieve 4 METS without cardiac limitations. Therefore, based on ACC/AHA guidelines, the patient would be at acceptable risk for the planned procedure without further cardiovascular testing. The patient was advised that if he develops new symptoms prior to surgery to contact our office to arrange for a follow-up visit, and he verbalized understanding.   The patient was advised that if he develops new symptoms prior to surgery to contact our office to arrange for a follow-up visit, and he verbalized understanding.  Patient was advised to hold Eliquis 2 days prior to procedure and restart when surgically safe postprocedure.  A copy of this note will be routed to requesting surgeon.  Time:   Today, I have spent 8 minutes with the patient with telehealth technology discussing medical history, symptoms, and  management plan.     Napoleon Form, Leodis Rains, NP  06/13/2022, 7:03 PM

## 2022-06-15 ENCOUNTER — Ambulatory Visit: Payer: Medicare PPO | Attending: Cardiovascular Disease

## 2022-06-15 DIAGNOSIS — Z01810 Encounter for preprocedural cardiovascular examination: Secondary | ICD-10-CM

## 2022-07-05 DIAGNOSIS — I10 Essential (primary) hypertension: Secondary | ICD-10-CM | POA: Diagnosis not present

## 2022-07-05 DIAGNOSIS — E039 Hypothyroidism, unspecified: Secondary | ICD-10-CM | POA: Diagnosis not present

## 2022-07-05 DIAGNOSIS — G4700 Insomnia, unspecified: Secondary | ICD-10-CM | POA: Diagnosis not present

## 2022-07-05 DIAGNOSIS — I700 Atherosclerosis of aorta: Secondary | ICD-10-CM | POA: Diagnosis not present

## 2022-07-05 DIAGNOSIS — I480 Paroxysmal atrial fibrillation: Secondary | ICD-10-CM | POA: Diagnosis not present

## 2022-07-05 DIAGNOSIS — D696 Thrombocytopenia, unspecified: Secondary | ICD-10-CM | POA: Diagnosis not present

## 2022-07-05 DIAGNOSIS — D6869 Other thrombophilia: Secondary | ICD-10-CM | POA: Diagnosis not present

## 2022-07-07 ENCOUNTER — Encounter (HOSPITAL_BASED_OUTPATIENT_CLINIC_OR_DEPARTMENT_OTHER): Payer: Self-pay | Admitting: Surgery

## 2022-07-15 MED ORDER — CHLORHEXIDINE GLUCONATE CLOTH 2 % EX PADS: 6.0000 | MEDICATED_PAD | Freq: Once | CUTANEOUS | Status: DC

## 2022-07-15 MED ORDER — ENSURE PRE-SURGERY PO LIQD: 296.0000 mL | Freq: Once | ORAL | Status: DC

## 2022-07-15 NOTE — Progress Notes (Signed)

## 2022-07-18 NOTE — H&P (Signed)
REFERRING PHYSICIAN: Blenda Bridegroom, MD PROVIDER: Wayne Both, MD MRN: [id number] DOB: [birth year]  Subjective  Chief Complaint: New Consultation (Hernia)  History of Present Illness: Christian Jennings is a 72 y.o. male who is seen  as an office consultation for evaluation of New Consultation (Hernia)  This is a pleasant 72 year old gentleman who is referred here for evaluation of a right inguinal hernia. He reports he has had a approximately 18 months. He thinks it developed after straining for constipation. He reports that easily reduces and has no discomfort but is getting larger. He is on Eliquis for a previous history of CVA and A-fib but he has had a cardiac ablation and saw his cardiologist back in February. He has been doing well otherwise from a cardiac standpoint. He denies any nausea or vomiting  Review of Systems: A complete review of systems was obtained from the patient. I have reviewed this information and discussed as appropriate with the patient. See HPI as well for other ROS.  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 (CMS/HHS-HCC)  Past Surgical History: Procedure Laterality Date heart ablation 11/10/2021   No Known Allergies  Current Outpatient Medications on File Prior to Visit Medication Sig Dispense Refill apixaban (ELIQUIS) 5 mg tablet Take 5 mg by mouth every 12 (twelve) hours atorvastatin (LIPITOR) 20 MG tablet Take 20 mg by mouth once daily levothyroxine (SYNTHROID) 25 MCG tablet TAKE 1 TABLET BY MOUTH ONCE DAILY IN THE MORNING ON AN EMPTY STOMACH lisinopriL (ZESTRIL) 20 MG tablet Take 20 mg by mouth once daily metoprolol tartrate (LOPRESSOR) 25 MG tablet Take 25 mg by mouth 2 (two) times daily traZODone (DESYREL) 100 MG tablet Take  200 mg by mouth at bedtime amLODIPine (NORVASC) 10 MG tablet Take 10 mg by mouth once daily (Patient not taking: Reported on 06/07/2022) eszopiclone (LUNESTA) 1 MG tablet Take 1 mg by mouth nightly Take immediately before bedtime. (Patient not taking: Reported on 06/07/2022) hydroCHLOROthiazide (HYDRODIURIL) 25 MG tablet Take 25 mg by mouth once daily (Patient not taking: Reported on 06/07/2022)  No current facility-administered medications on file prior to visit.  Family History Problem Relation Age of Onset High blood pressure (Hypertension) Mother   Social History  Tobacco Use Smoking Status Never Smokeless Tobacco Never   Social History  Socioeconomic History Marital status: Married Tobacco Use Smoking status: Never Smokeless tobacco: Never Vaping Use Vaping status: Never Used Substance and Sexual Activity Alcohol use: Not Currently Drug use: Never  Objective:  Vitals:  BP: (!) 180/88 Pulse: 76 Temp: 37 C (98.6 F) SpO2: 97% Weight: 65 kg (143 lb 3.2 oz) Height: 162.6 cm (5\' 4" ) PainSc: 0-No pain  Body mass index is 24.58 kg/m.  Physical Exam  He appears well on exam  Abdomen is soft and nontender.  There is a moderate sized, reducible right inguinal hernia without evidence of left inguinal hernia  Labs, Imaging and Diagnostic Testing: I have reviewed his notes in the electronic medical records  Assessment and Plan:  Diagnoses and all orders for this visit:  Right inguinal hernia   I explained abdominal wall anatomy to the patient and his wife. We discussed hernias in general. We discussed the reasons to repair hernias. As his hernia is getting larger and symptomatic and he is on blood thinning medication, elective repair of the hernia is recommended to prevent  risks of incarceration or strangulation and the need for urgent repair. We next discussed proceeding with a right inguinal hernia pair with mesh. I discussed both the laparoscopic and open  techniques. I would recommend an open right inguinal hernia pair with mesh with LMA and a tap block if possible to limit anesthesia as well as risk of bleeding postoperatively with resumption of his Eliquis. I explained the surgical procedure in detail. We discussed the risks which includes but is not limited to bleeding, infection, injury to surrounding structures, use of mesh, nerve entrapment, chronic pain, hernia recurrence, cardiopulmonary issues, postoperative recovery, etc. They understand and wish to proceed with surgery which will be scheduled

## 2022-07-19 ENCOUNTER — Ambulatory Visit (HOSPITAL_BASED_OUTPATIENT_CLINIC_OR_DEPARTMENT_OTHER): Payer: Medicare PPO | Admitting: Anesthesiology

## 2022-07-19 ENCOUNTER — Other Ambulatory Visit: Payer: Self-pay

## 2022-07-19 ENCOUNTER — Encounter (HOSPITAL_BASED_OUTPATIENT_CLINIC_OR_DEPARTMENT_OTHER): Payer: Self-pay | Admitting: Surgery

## 2022-07-19 ENCOUNTER — Encounter (HOSPITAL_BASED_OUTPATIENT_CLINIC_OR_DEPARTMENT_OTHER)
Admission: RE | Disposition: A | Discharge status: Home / Self Care | Payer: Self-pay | Source: Home / Self Care | Attending: Surgery

## 2022-07-19 ENCOUNTER — Ambulatory Visit (HOSPITAL_BASED_OUTPATIENT_CLINIC_OR_DEPARTMENT_OTHER)
Admission: RE | Admit: 2022-07-19 | Discharge: 2022-07-19 | Disposition: A | Discharge status: Home / Self Care | Payer: Medicare PPO | Attending: Surgery | Admitting: Surgery

## 2022-07-19 DIAGNOSIS — K4090 Unilateral inguinal hernia, without obstruction or gangrene, not specified as recurrent: Secondary | ICD-10-CM

## 2022-07-19 DIAGNOSIS — E039 Hypothyroidism, unspecified: Secondary | ICD-10-CM | POA: Diagnosis not present

## 2022-07-19 DIAGNOSIS — Z8673 Personal history of transient ischemic attack (TIA), and cerebral infarction without residual deficits: Secondary | ICD-10-CM | POA: Diagnosis not present

## 2022-07-19 DIAGNOSIS — I4891 Unspecified atrial fibrillation: Secondary | ICD-10-CM | POA: Diagnosis not present

## 2022-07-19 DIAGNOSIS — Z01818 Encounter for other preprocedural examination: Secondary | ICD-10-CM

## 2022-07-19 DIAGNOSIS — I10 Essential (primary) hypertension: Secondary | ICD-10-CM | POA: Diagnosis not present

## 2022-07-19 DIAGNOSIS — Z7901 Long term (current) use of anticoagulants: Secondary | ICD-10-CM | POA: Insufficient documentation

## 2022-07-19 DIAGNOSIS — G8918 Other acute postprocedural pain: Secondary | ICD-10-CM | POA: Diagnosis not present

## 2022-07-19 HISTORY — DX: Hypothyroidism, unspecified: E03.9

## 2022-07-19 HISTORY — PX: INGUINAL HERNIA REPAIR: SHX194

## 2022-07-19 SURGERY — REPAIR, HERNIA, INGUINAL, ADULT
Anesthesia: Regional | Site: Inguinal | Laterality: Right

## 2022-07-19 MED ORDER — ROPIVACAINE HCL 5 MG/ML IJ SOLN
INTRAMUSCULAR | Status: DC | PRN
  Administered 2022-07-19: 25 mL via PERINEURAL

## 2022-07-19 MED ORDER — FENTANYL CITRATE (PF) 100 MCG/2ML IJ SOLN: 25.0000 ug | INTRAMUSCULAR | Status: DC | PRN

## 2022-07-19 MED ORDER — PROPOFOL 10 MG/ML IV BOLUS
INTRAVENOUS | Status: DC | PRN
  Administered 2022-07-19: 120 mg via INTRAVENOUS

## 2022-07-19 MED ORDER — CEFAZOLIN SODIUM-DEXTROSE 2-4 GM/100ML-% IV SOLN
2.0000 g | INTRAVENOUS | Status: AC
  Administered 2022-07-19: 2 g via INTRAVENOUS

## 2022-07-19 MED ORDER — OXYCODONE HCL 5 MG PO TABS: 5.0000 mg | ORAL_TABLET | Freq: Once | ORAL | Status: DC | PRN

## 2022-07-19 MED ORDER — ACETAMINOPHEN 500 MG PO TABS
1000.0000 mg | ORAL_TABLET | ORAL | Status: AC
  Administered 2022-07-19: 1000 mg via ORAL

## 2022-07-19 MED ORDER — ONDANSETRON HCL 4 MG/2ML IJ SOLN: 4.0000 mg | Freq: Four times a day (QID) | INTRAMUSCULAR | Status: DC | PRN

## 2022-07-19 MED ORDER — HYDRALAZINE HCL 20 MG/ML IJ SOLN
INTRAMUSCULAR | Status: AC
  Filled 2022-07-19: qty 1

## 2022-07-19 MED ORDER — OXYCODONE HCL 5 MG/5ML PO SOLN: 5.0000 mg | Freq: Once | ORAL | Status: DC | PRN

## 2022-07-19 MED ORDER — FENTANYL CITRATE (PF) 100 MCG/2ML IJ SOLN
INTRAMUSCULAR | Status: DC | PRN
  Administered 2022-07-19: 50 ug via INTRAVENOUS

## 2022-07-19 MED ORDER — HYDRALAZINE HCL 20 MG/ML IJ SOLN
10.0000 mg | Freq: Once | INTRAMUSCULAR | Status: AC
  Administered 2022-07-19: 10 mg via INTRAVENOUS

## 2022-07-19 MED ORDER — FENTANYL CITRATE (PF) 100 MCG/2ML IJ SOLN
100.0000 ug | Freq: Once | INTRAMUSCULAR | Status: AC
  Administered 2022-07-19: 100 ug via INTRAVENOUS

## 2022-07-19 MED ORDER — BUPIVACAINE-EPINEPHRINE 0.5% -1:200000 IJ SOLN
INTRAMUSCULAR | Status: DC | PRN
  Administered 2022-07-19: 10 mL

## 2022-07-19 MED ORDER — DEXAMETHASONE SODIUM PHOSPHATE 4 MG/ML IJ SOLN
INTRAMUSCULAR | Status: DC | PRN
  Administered 2022-07-19: 5 mg via INTRAVENOUS

## 2022-07-19 MED ORDER — ACETAMINOPHEN 500 MG PO TABS
ORAL_TABLET | ORAL | Status: AC
  Filled 2022-07-19: qty 2

## 2022-07-19 MED ORDER — LIDOCAINE 2% (20 MG/ML) 5 ML SYRINGE
INTRAMUSCULAR | Status: DC | PRN
  Administered 2022-07-19: 60 mg via INTRAVENOUS

## 2022-07-19 MED ORDER — CEFAZOLIN SODIUM-DEXTROSE 2-4 GM/100ML-% IV SOLN
INTRAVENOUS | Status: AC
  Filled 2022-07-19: qty 100

## 2022-07-19 MED ORDER — FENTANYL CITRATE (PF) 100 MCG/2ML IJ SOLN
INTRAMUSCULAR | Status: AC
  Filled 2022-07-19: qty 2

## 2022-07-19 MED ORDER — TRAMADOL HCL 50 MG PO TABS: 50.0000 mg | ORAL_TABLET | Freq: Four times a day (QID) | ORAL | 0 refills | Status: AC | PRN

## 2022-07-19 MED ORDER — LACTATED RINGERS IV SOLN: INTRAVENOUS | Status: DC

## 2022-07-19 MED ORDER — ONDANSETRON HCL 4 MG/2ML IJ SOLN
INTRAMUSCULAR | Status: DC | PRN
  Administered 2022-07-19: 4 mg via INTRAVENOUS

## 2022-07-19 MED ORDER — EPHEDRINE SULFATE (PRESSORS) 50 MG/ML IJ SOLN
INTRAMUSCULAR | Status: DC | PRN
  Administered 2022-07-19 (×3): 10 mg via INTRAVENOUS

## 2022-07-19 MED ORDER — MIDAZOLAM HCL 2 MG/2ML IJ SOLN
INTRAMUSCULAR | Status: AC
  Filled 2022-07-19: qty 2

## 2022-07-19 SURGICAL SUPPLY — 42 items
ADH SKN CLS APL DERMABOND .7 (GAUZE/BANDAGES/DRESSINGS) ×1
APL PRP STRL LF DISP 70% ISPRP (MISCELLANEOUS) ×1
BLADE CLIPPER SURG (BLADE) ×1 IMPLANT
BLADE SURG 15 STRL LF DISP TIS (BLADE) ×1 IMPLANT
BLADE SURG 15 STRL SS (BLADE) ×1
CANISTER SUCT 1200ML W/VALVE (MISCELLANEOUS) IMPLANT
CHLORAPREP W/TINT 26 (MISCELLANEOUS) ×1 IMPLANT
COVER BACK TABLE 60X90IN (DRAPES) ×1 IMPLANT
COVER MAYO STAND STRL (DRAPES) ×1 IMPLANT
DERMABOND ADVANCED .7 DNX12 (GAUZE/BANDAGES/DRESSINGS) ×1 IMPLANT
DRAIN PENROSE .5X12 LATEX STL (DRAIN) ×1 IMPLANT
DRAPE LAPAROTOMY 100X72 PEDS (DRAPES) ×1 IMPLANT
DRAPE UTILITY XL STRL (DRAPES) ×1 IMPLANT
ELECT REM PT RETURN 9FT ADLT (ELECTROSURGICAL) ×1
ELECTRODE REM PT RTRN 9FT ADLT (ELECTROSURGICAL) ×1 IMPLANT
GLOVE BIOGEL PI MICRO STRL 5.5 (GLOVE) IMPLANT
GLOVE SURG SIGNA 7.5 PF LTX (GLOVE) ×1 IMPLANT
GOWN STRL REUS W/ TWL LRG LVL3 (GOWN DISPOSABLE) ×1 IMPLANT
GOWN STRL REUS W/ TWL XL LVL3 (GOWN DISPOSABLE) ×1 IMPLANT
GOWN STRL REUS W/TWL LRG LVL3 (GOWN DISPOSABLE) ×1
GOWN STRL REUS W/TWL XL LVL3 (GOWN DISPOSABLE) ×1
MESH PARIETEX PROGRIP RIGHT (Mesh General) IMPLANT
NDL HYPO 25X1 1.5 SAFETY (NEEDLE) ×1 IMPLANT
NEEDLE HYPO 25X1 1.5 SAFETY (NEEDLE) ×1 IMPLANT
NS IRRIG 1000ML POUR BTL (IV SOLUTION) IMPLANT
PACK BASIN DAY SURGERY FS (CUSTOM PROCEDURE TRAY) ×1 IMPLANT
PENCIL SMOKE EVACUATOR (MISCELLANEOUS) ×1 IMPLANT
SLEEVE SCD COMPRESS KNEE MED (STOCKING) ×1 IMPLANT
SPIKE FLUID TRANSFER (MISCELLANEOUS) IMPLANT
SPONGE INTESTINAL PEANUT (DISPOSABLE) IMPLANT
SPONGE T-LAP 4X18 ~~LOC~~+RFID (SPONGE) ×1 IMPLANT
SUT MNCRL AB 4-0 PS2 18 (SUTURE) ×1 IMPLANT
SUT SILK 2 0 SH (SUTURE) IMPLANT
SUT VIC AB 2-0 CT1 27 (SUTURE) ×2
SUT VIC AB 2-0 CT1 TAPERPNT 27 (SUTURE) ×2 IMPLANT
SUT VIC AB 3-0 CT1 27 (SUTURE) ×2
SUT VIC AB 3-0 CT1 27XBRD (SUTURE) ×1 IMPLANT
SYR BULB EAR ULCER 3OZ GRN STR (SYRINGE) IMPLANT
SYR CONTROL 10ML LL (SYRINGE) ×1 IMPLANT
TOWEL GREEN STERILE FF (TOWEL DISPOSABLE) ×1 IMPLANT
TUBE CONNECTING 20X1/4 (TUBING) IMPLANT
YANKAUER SUCT BULB TIP NO VENT (SUCTIONS) IMPLANT

## 2022-07-19 NOTE — Progress Notes (Signed)
Assisted Dr. Hodierne with right, transabdominal plane, ultrasound guided block. Side rails up, monitors on throughout procedure. See vital signs in flow sheet. Tolerated Procedure well. 

## 2022-07-19 NOTE — Transfer of Care (Signed)
Immediate Anesthesia Transfer of Care Note  Patient: Christian Jennings  Procedure(s) Performed: OPEN RIGHT INGUINAL HERNIA REPAIR WITH MESH (Right: Inguinal)  Patient Location: PACU  Anesthesia Type:GA combined with regional for post-op pain  Level of Consciousness: sedated  Airway & Oxygen Therapy: Patient Spontanous Breathing and Patient connected to face mask oxygen  Post-op Assessment: Report given to RN and Post -op Vital signs reviewed and stable  Post vital signs: Reviewed and stable  Last Vitals:  Vitals Value Taken Time  BP 135/73 07/19/22 1428  Temp    Pulse 54 07/19/22 1428  Resp 11 07/19/22 1428  SpO2 99 % 07/19/22 1428  Vitals shown include unvalidated device data.  Last Pain:  Vitals:   07/19/22 1145  TempSrc: Oral  PainSc: 0-No pain      Patients Stated Pain Goal: 4 (07/19/22 1145)  Complications: No notable events documented.

## 2022-07-19 NOTE — Interval H&P Note (Signed)
History and Physical Interval Note: no change in H and P  07/19/2022 11:50 AM  Christian Jennings II  has presented today for surgery, with the diagnosis of RIGHT INGUINAL HERNIA.  The various methods of treatment have been discussed with the patient and family. After consideration of risks, benefits and other options for treatment, the patient has consented to  Procedure(s) with comments: OPEN RIGHT INGUINAL HERNIA REPAIR WITH MESH (Right) - LMA AND TAP BLOCK as a surgical intervention.  The patient's history has been reviewed, patient examined, no change in status, stable for surgery.  I have reviewed the patient's chart and labs.  Questions were answered to the patient's satisfaction.     [name]

## 2022-07-19 NOTE — Anesthesia Postprocedure Evaluation (Signed)
Anesthesia Post Note  Patient: Christian Jennings  Procedure(s) Performed: OPEN RIGHT INGUINAL HERNIA REPAIR WITH MESH (Right: Inguinal)     Patient location during evaluation: PACU Anesthesia Type: Regional and General Level of consciousness: awake and alert Pain management: pain level controlled Vital Signs Assessment: post-procedure vital signs reviewed and stable Respiratory status: spontaneous breathing, nonlabored ventilation, respiratory function stable and patient connected to nasal cannula oxygen Cardiovascular status: blood pressure returned to baseline and stable Postop Assessment: no apparent nausea or vomiting Anesthetic complications: no   No notable events documented.  Last Vitals:  Vitals:   07/19/22 1500 07/19/22 1515  BP: (!) 168/86 (!) 177/88  Pulse: (!) 59 (!) 59  Resp: 15 15  Temp:    SpO2: 100% 97%    Last Pain:  Vitals:   07/19/22 1515  TempSrc:   PainSc: Asleep                 Shakera Ebrahimi S

## 2022-07-19 NOTE — Anesthesia Procedure Notes (Signed)
Anesthesia Regional Block: TAP block   Pre-Anesthetic Checklist: , timeout performed,  Correct Patient, Correct Site, Correct Laterality,  Correct Procedure, Correct Position, site marked,  Risks and benefits discussed,  Surgical consent,  Pre-op evaluation,  At surgeon's request and post-op pain management  Laterality: Right  Prep: chloraprep       Needles:  Injection technique: Single-shot  Needle Type: Echogenic Needle     Needle Length: 9cm  Needle Gauge: 21     Additional Needles:   Narrative:  Start time: 07/19/2022 12:40 PM End time: 07/19/2022 12:45 PM Injection made incrementally with aspirations every 5 mL.  Performed by: Personally  Anesthesiologist: Achille Rich, MD  Additional Notes: Pt tolerated the procedure well.

## 2022-07-19 NOTE — Anesthesia Procedure Notes (Signed)
Procedure Name: LMA Insertion Date/Time: 07/19/2022 1:34 PM  Performed by: Burna Cash, CRNAPre-anesthesia Checklist: Patient identified, Emergency Drugs available, Suction available and Patient being monitored Patient Re-evaluated:Patient Re-evaluated prior to induction Oxygen Delivery Method: Circle system utilized Preoxygenation: Pre-oxygenation with 100% oxygen Induction Type: IV induction Ventilation: Mask ventilation without difficulty LMA: LMA inserted LMA Size: 4.0 Number of attempts: 1 Airway Equipment and Method: Bite block Placement Confirmation: positive ETCO2 Tube secured with: Tape Dental Injury: Teeth and Oropharynx as per pre-operative assessment

## 2022-07-19 NOTE — Anesthesia Preprocedure Evaluation (Signed)
Anesthesia Evaluation  Patient identified by MRN, date of birth, ID band Patient awake    Reviewed: Allergy & Precautions, H&P , NPO status , Patient's Chart, lab work & pertinent test results  Airway Mallampati: II   Neck ROM: full    Dental   Pulmonary neg pulmonary ROS   breath sounds clear to auscultation       Cardiovascular hypertension, + dysrhythmias Atrial Fibrillation  Rhythm:regular Rate:Normal     Neuro/Psych CVA    GI/Hepatic   Endo/Other  Hypothyroidism    Renal/GU      Musculoskeletal   Abdominal   Peds  Hematology   Anesthesia Other Findings   Reproductive/Obstetrics                             Anesthesia Physical Anesthesia Plan  ASA: 3  Anesthesia Plan: General   Post-op Pain Management: Regional block*   Induction: Intravenous  PONV Risk Score and Plan: 2 and Ondansetron, Dexamethasone and Treatment may vary due to age or medical condition  Airway Management Planned: LMA  Additional Equipment:   Intra-op Plan:   Post-operative Plan: Extubation in OR  Informed Consent: I have reviewed the patients History and Physical, chart, labs and discussed the procedure including the risks, benefits and alternatives for the proposed anesthesia with the patient or authorized representative who has indicated his/her understanding and acceptance.     Dental advisory given  Plan Discussed with: CRNA, Anesthesiologist and Surgeon  Anesthesia Plan Comments:        Anesthesia Quick Evaluation

## 2022-07-19 NOTE — Op Note (Signed)
OPEN RIGHT INGUINAL HERNIA REPAIR WITH MESH  Procedure Note  NIO GUTTILLA II 07/19/2022   Pre-op Diagnosis: RIGHT INGUINAL HERNIA     Post-op Diagnosis: same  Procedure(s): OPEN RIGHT INGUINAL HERNIA REPAIR WITH MESH  Surgeon(s): [name], MD Margarito Courser, MD [facility] Resident  Anesthesia: General  Staff:  Circulator: Janace Aris, RN Scrub Person: Raliegh Scarlet, RN  Estimated Blood Loss: Minimal               Findings: The patient was found to have a right direct inguinal hernia which was repaired with a large piece of Prolene ProGrip mesh from [employer]  Procedure: The patient was brought to the operating identified as the correct patient.  He was placed upon the operating table and general anesthesia was induced.  His abdomen was then prepped and draped in the usual sterile fashion.  We anesthetized the skin in the right inguinal area with Marcaine and then made a longitudinal incision with a scalpel.  We then carried this down through Scarpa's fascia with electrocautery.  The external oblique fascia was then identified and opened toward the internal and external ring.  The testicular cord and structures were easily identified.  The patient had a large direct hernia sac.  There was no bowel involvement the sac.  We reduced it back through the inguinal floor and there were able to close the floor with a single figure-of-eight 2-0 silk suture.  There was no evidence of indirect hernia.  Next a large piece of Prolene ProGrip mesh from [employer] was brought to the field.  It was placed against the pubic tubercle and then brought around the cord structures and over the internal ring widely covering the inguinal floor.  A second suture was used to suture to the pubic tubercle and traverse Allis fascia.  Again the mesh fixated in place very well.  We are then able to close the external oblique fascia over the top of the mesh with a running 3-0 Vicryl suture.  Hemostasis appeared to be  achieved.  Scarpa's fascia was then closed with interrupted 3-0 Vicryl sutures and the skin was closed with running 4-0 Monocryl.  Dermabond was then applied.  The patient tolerated the procedure well.  All the counts were correct at the end of the procedure.  The patient was then extubated in the operating room and taken in a stable condition to the recovery room.          [name]   Date: 07/19/2022  Time: 2:11 PM

## 2022-07-19 NOTE — Discharge Instructions (Addendum)
[facility] _______[facility]  UMBILICAL OR INGUINAL HERNIA REPAIR: POST OP INSTRUCTIONS  Always review your discharge instruction sheet given to you by the facility where your surgery was performed. IF YOU HAVE DISABILITY OR FAMILY LEAVE FORMS, YOU MUST BRING THEM TO THE OFFICE FOR PROCESSING.   DO NOT GIVE THEM TO YOUR DOCTOR.  1. A  prescription for pain medication may be given to you upon discharge.  Take your pain medication as prescribed, if needed.  If narcotic pain medicine is not needed, then you may take acetaminophen (Tylenol) or ibuprofen (Advil) as needed. 2. Take your usually prescribed medications unless otherwise directed. If you need a refill on your pain medication, please contact your pharmacy.  They will contact our office to request authorization. Prescriptions will not be filled after 5 pm or on week-ends. 3. You should follow a light diet the first 24 hours after arrival home, such as soup and crackers, etc.  Be sure to include lots of fluids daily.  Resume your normal diet the day after surgery. 4.Most patients will experience some swelling and bruising around the umbilicus or in the groin and scrotum.  Ice packs and reclining will help.  Swelling and bruising can take several days to resolve.  6. It is common to experience some constipation if taking pain medication after surgery.  Increasing fluid intake and taking a stool softener (such as Colace) will usually help or prevent this problem from occurring.  A mild laxative (Milk of Magnesia or Miralax) should be taken according to package directions if there are no bowel movements after 48 hours. 7. Unless discharge instructions indicate otherwise, you may remove your bandages 24-48 hours after surgery, and you may shower at that time.  You may have steri-strips (small skin tapes) in place directly over the incision.  These strips should be left on the skin for 7-10 days.  If your surgeon used skin glue on the  incision, you may shower in 24 hours.  The glue will flake off over the next 2-3 weeks.  Any sutures or staples will be removed at the office during your follow-up visit. 8. ACTIVITIES:  You may resume regular (light) daily activities beginning the next day--such as daily self-care, walking, climbing stairs--gradually increasing activities as tolerated.  You may have sexual intercourse when it is comfortable.  Refrain from any heavy lifting or straining until approved by your doctor.  a.You may drive when you are no longer taking prescription pain medication, you can comfortably wear a seatbelt, and you can safely maneuver your car and apply brakes. b.RETURN TO WORK:   _____________________________________________  9.You should see your doctor in the office for a follow-up appointment approximately 2-3 weeks after your surgery.  Make sure that you call for this appointment within a day or two after you arrive home to insure a convenient appointment time. 10.OTHER INSTRUCTIONS: YOU MAY SHOWER STARTING TOMORROW ICE PACK AND TYLENOL ALSO FOR PAIN NO LIFTING MORE THAN 15 POUNDS FOR 4 WEEKS  RESUME ELIQUIS STARTING TOMORROW_________________________    _____________________________________  WHEN TO CALL YOUR DOCTOR: Fever over 101.0 Inability to urinate Nausea and/or vomiting Extreme swelling or bruising Continued bleeding from incision. Increased pain, redness, or drainage from the incision  The clinic staff is available to answer your questions during regular business hours.  Please don't hesitate to call and ask to speak to one of the nurses for clinical concerns.  If you have a medical emergency, go to the nearest emergency room  or call 911.  A surgeon from [facility] is always on call at the hospital   [address], [address], [place], [place]  [address] ?  [address] [address], [place], [place]   [address] [phone] ? [phone] ? FAX [phone] Web site:  [email or web address]    Post Anesthesia Home Care Instructions  Activity: Get plenty of rest for the remainder of the day. A responsible individual must stay with you for 24 hours following the procedure.  For the next 24 hours, DO NOT: -Drive a car -Advertising copywriter -Drink alcoholic beverages -Take any medication unless instructed by your physician -Make any legal decisions or sign important papers.  Meals: Start with liquid foods such as gelatin or soup. Progress to regular foods as tolerated. Avoid greasy, spicy, heavy foods. If nausea and/or vomiting occur, drink only clear liquids until the nausea and/or vomiting subsides. Call your physician if vomiting continues.  Special Instructions/Symptoms: Your throat may feel dry or sore from the anesthesia or the breathing tube placed in your throat during surgery. If this causes discomfort, gargle with warm salt water. The discomfort should disappear within 24 hours.  If you had a scopolamine patch placed behind your ear for the management of post- operative nausea and/or vomiting:  1. The medication in the patch is effective for 72 hours, after which it should be removed.  Wrap patch in a tissue and discard in the trash. Wash hands thoroughly with soap and water. 2. You may remove the patch earlier than 72 hours if you experience unpleasant side effects which may include dry mouth, dizziness or visual disturbances. 3. Avoid touching the patch. Wash your hands with soap and water after contact with the patch.  Regional Anesthesia Blocks  1. Numbness or the inability to move the "blocked" extremity may last from 3-48 hours after placement. The length of time depends on the medication injected and your individual response to the medication. If the numbness is not going away after 48 hours, call your surgeon.  2. The extremity that is blocked will need to be protected until the numbness is gone and the  Strength has returned.  Because you cannot feel it, you will need to take extra care to avoid injury. Because it may be weak, you may have difficulty moving it or using it. You may not know what position it is in without looking at it while the block is in effect.  3. For blocks in the legs and feet, returning to weight bearing and walking needs to be done carefully. You will need to wait until the numbness is entirely gone and the strength has returned. You should be able to move your leg and foot normally before you try and bear weight or walk. You will need someone to be with you when you first try to ensure you do not fall and possibly risk injury.  4. Bruising and tenderness at the needle site are common side effects and will resolve in a few days.  5. Persistent numbness or new problems with movement should be communicated to the surgeon or the [facility] [phone] [facility] [phone]).  *May have Tylenol today at 6pm 07/19/22

## 2022-07-20 ENCOUNTER — Encounter (HOSPITAL_BASED_OUTPATIENT_CLINIC_OR_DEPARTMENT_OTHER): Payer: Self-pay | Admitting: Surgery

## 2022-08-02 DIAGNOSIS — I10 Essential (primary) hypertension: Secondary | ICD-10-CM | POA: Diagnosis not present

## 2022-09-24 ENCOUNTER — Other Ambulatory Visit: Payer: Self-pay | Admitting: Cardiology

## 2022-09-24 DIAGNOSIS — I10 Essential (primary) hypertension: Secondary | ICD-10-CM | POA: Diagnosis not present

## 2022-09-24 DIAGNOSIS — I480 Paroxysmal atrial fibrillation: Secondary | ICD-10-CM

## 2022-09-24 DIAGNOSIS — L989 Disorder of the skin and subcutaneous tissue, unspecified: Secondary | ICD-10-CM | POA: Diagnosis not present

## 2022-09-24 NOTE — Telephone Encounter (Signed)
Prescription refill request for Eliquis received. Indication:afib Last office visit:2/24 Scr:1.07  9/23 Age: 72 Weight:64  kg  Prescription refilled

## 2022-10-26 DIAGNOSIS — D1801 Hemangioma of skin and subcutaneous tissue: Secondary | ICD-10-CM | POA: Diagnosis not present

## 2022-10-26 DIAGNOSIS — D485 Neoplasm of uncertain behavior of skin: Secondary | ICD-10-CM | POA: Diagnosis not present

## 2023-01-07 DIAGNOSIS — R7989 Other specified abnormal findings of blood chemistry: Secondary | ICD-10-CM | POA: Diagnosis not present

## 2023-01-07 DIAGNOSIS — R946 Abnormal results of thyroid function studies: Secondary | ICD-10-CM | POA: Diagnosis not present

## 2023-01-07 DIAGNOSIS — I4891 Unspecified atrial fibrillation: Secondary | ICD-10-CM | POA: Diagnosis not present

## 2023-01-07 DIAGNOSIS — Z8673 Personal history of transient ischemic attack (TIA), and cerebral infarction without residual deficits: Secondary | ICD-10-CM | POA: Diagnosis not present

## 2023-01-07 DIAGNOSIS — I10 Essential (primary) hypertension: Secondary | ICD-10-CM | POA: Diagnosis not present

## 2023-01-07 DIAGNOSIS — Z0000 Encounter for general adult medical examination without abnormal findings: Secondary | ICD-10-CM | POA: Diagnosis not present

## 2023-01-07 DIAGNOSIS — Z1211 Encounter for screening for malignant neoplasm of colon: Secondary | ICD-10-CM | POA: Diagnosis not present

## 2023-01-07 DIAGNOSIS — E785 Hyperlipidemia, unspecified: Secondary | ICD-10-CM | POA: Diagnosis not present

## 2023-01-07 DIAGNOSIS — R7309 Other abnormal glucose: Secondary | ICD-10-CM | POA: Diagnosis not present

## 2023-03-22 ENCOUNTER — Other Ambulatory Visit: Payer: Self-pay | Admitting: Cardiology

## 2023-03-22 DIAGNOSIS — I480 Paroxysmal atrial fibrillation: Secondary | ICD-10-CM

## 2023-03-22 DIAGNOSIS — Z8673 Personal history of transient ischemic attack (TIA), and cerebral infarction without residual deficits: Secondary | ICD-10-CM | POA: Diagnosis not present

## 2023-03-22 DIAGNOSIS — R531 Weakness: Secondary | ICD-10-CM | POA: Diagnosis not present

## 2023-03-22 DIAGNOSIS — F32A Depression, unspecified: Secondary | ICD-10-CM | POA: Diagnosis not present

## 2023-03-22 NOTE — Telephone Encounter (Signed)
Prescription refill request for Eliquis received. Indication:afib Last office visit:2/24 Scr:1.16  11/24 Age: 73 Weight:64  kg  Prescription refilled

## 2023-03-28 ENCOUNTER — Ambulatory Visit: Payer: Medicare PPO | Admitting: Student

## 2023-04-07 ENCOUNTER — Ambulatory Visit: Payer: Medicare PPO | Admitting: Student

## 2023-04-26 NOTE — Progress Notes (Unsigned)
 Cardiology Office Note:  .   Date:  04/26/2023  ID:  Christian Jennings, DOB Jul 04, 1950, MRN [id number] PCP: Farris Has, MD  [facility] Providers Cardiologist:  None Electrophysiologist:  Lanier Prude, MD {  History of Present Illness: .   Christian Jennings is a 73 y.o. male w/PMHx of  HTN, HLD CVA AFib  Saw Dr. Lalla Brothers 03/26/2022, doing well, monitors his pulse at home, reporting 60's typically, BP 140'/60s Advised to monitor his BP and f/u with his PMD Maintained on Eliquis  Today's visit is scheduled as an annual visit  ROS:   He is accompanied by his wife Generally doing well Had a inguinal hernia surgery a year or so ago and seemed to set him back a bit. Had some struggles with some depression h is PMD working with him, getting ready to start PT/exercise program Not a total couch potato though Vacationed in [place], did a lot of walking with good exertional capacit No symptoms of AFib, no CP or cardiac awareness No SOB Occ sees a bit of swelling ankles No DOE No near syncope or syncope No bleeding/signs of bleeding  Arrhythmia/AAD hx AFib found 2017 AFib ablation 11/10/21  Studies Reviewed: Marland Kitchen    EKG done today and reviewed by myself:  SB 52bpm   11/10/21: EPS/ablation CONCLUSIONS: 1. Successful PVI 2. Intracardiac echo reveals trivial pericardial effusion and normal LA architecture 3. No early apparent complications. 4. Colchicine 0.6mg  PO BID x 5 days 5. Protonix 40mg  PO daily x 45 days  11/03/2021: Cardiac CT IMPRESSION: 1. There is normal pulmonary vein drainage into the left atrium with ostial measurements above. 2. There is no thrombus in the left atrial appendage. 3. The esophagus runs in the left atrial midline and is not in proximity to any of the pulmonary vein ostia. 4. No PFO/ASD. 5. Normal coronary origin. Right dominance. 6. CAC score of 1050 which is 84 percentile for age-, race-, and sex-matched controls.   11/16/2015:  TTE Study Conclusions  - Left ventricle: The cavity size was normal. Wall thickness was    normal. Systolic function was vigorous. The estimated ejection    fraction was in the range of 65% to 70%. The study was not    technically sufficient to allow evaluation of LV diastolic    dysfunction due to atrial fibrillation.  - Aortic valve: There was trivial regurgitation. Valve area (VTI):    2.7 cm^2. Valve area (Vmax): 2.46 cm^2.  - Left atrium: The atrium was mildly dilated.  - Technically adequate study.   Risk Assessment/Calculations:    Physical Exam:   VS:  There were no vitals taken for this visit.   Wt Readings from Last 3 Encounters:  07/19/22 141 lb 1.5 oz (64 kg)  03/26/22 146 lb (66.2 kg)  12/11/21 142 lb (64.4 kg)    GEN: Well nourished, well developed in no acute distress NECK: No JVD; No carotid bruits CARDIAC: RRR, no murmurs, rubs, gallops RESPIRATORY:   CTA b/l without rales, wheezing or rhonchi  ABDOMEN: Soft, non-tender, non-distended EXTREMITIES: No edema; No deformity   ASSESSMENT AND PLAN: .    paroxysmal AFib CHA2DS2Vasc is 4, on Eliquis, appropriately dosed no burden by symptoms  Labs done via his PMD Nov 2024 reviewed  HTN Better No changes today Amlodipine likely the cause of his swelling None appreciated today  Secondary hypercoagulable state 2/2 AFib     Dispo: continue annual visits with [place], sooner if needed  Signed, Sheilah Pigeon, PA-C

## 2023-04-27 DIAGNOSIS — M549 Dorsalgia, unspecified: Secondary | ICD-10-CM | POA: Diagnosis not present

## 2023-04-27 DIAGNOSIS — I480 Paroxysmal atrial fibrillation: Secondary | ICD-10-CM | POA: Diagnosis not present

## 2023-04-27 DIAGNOSIS — E785 Hyperlipidemia, unspecified: Secondary | ICD-10-CM | POA: Diagnosis not present

## 2023-04-27 DIAGNOSIS — F418 Other specified anxiety disorders: Secondary | ICD-10-CM | POA: Diagnosis not present

## 2023-04-28 ENCOUNTER — Encounter: Payer: Self-pay | Admitting: Physician Assistant

## 2023-04-28 ENCOUNTER — Ambulatory Visit: Payer: Medicare PPO | Attending: Physician Assistant | Admitting: Physician Assistant

## 2023-04-28 VITALS — BP 126/78 | HR 52 | Ht 64.0 in | Wt 141.4 lb

## 2023-04-28 DIAGNOSIS — I10 Essential (primary) hypertension: Secondary | ICD-10-CM

## 2023-04-28 DIAGNOSIS — D6869 Other thrombophilia: Secondary | ICD-10-CM | POA: Diagnosis not present

## 2023-04-28 DIAGNOSIS — I480 Paroxysmal atrial fibrillation: Secondary | ICD-10-CM

## 2023-04-28 NOTE — Patient Instructions (Signed)
 Medication Instructions:   Your physician recommends that you continue on your current medications as directed. Please refer to the Current Medication list given to you today.   *If you need a refill on your cardiac medications before your next appointment, please call your pharmacy*   Lab Work: NONE ORDERED  TODAY    If you have labs (blood work) drawn today and your tests are completely normal, you will receive your results only by: MyChart Message (if you have MyChart) OR A paper copy in the mail If you have any lab test that is abnormal or we need to change your treatment, we will call you to review the results.   Testing/Procedures: NONE ORDERED  TODAY      Follow-Up: At [facility], you and your health needs are our priority.  As part of our continuing mission to provide you with exceptional heart care, we have created designated Provider Care Teams.  These Care Teams include your primary Cardiologist (physician) and Advanced Practice Providers (APPs -  Physician Assistants and Nurse Practitioners) who all work together to provide you with the care you need, when you need it.  We recommend signing up for the patient portal called "MyChart".  Sign up information is provided on this After Visit Summary.  MyChart is used to connect with patients for Virtual Visits (Telemedicine).  Patients are able to view lab/test results, encounter notes, upcoming appointments, etc.  Non-urgent messages can be sent to your provider as well.   To learn more about what you can do with MyChart, go to [email or web address].    Your next appointment:    1 year(s)   Provider:    You may see Lanier Prude, MD   Francis Dowse, PA-C   Other Instructions    1st Floor: - Lobby - Registration  - Pharmacy  - Lab - Cafe  2nd Floor: - PV Lab - Diagnostic Testing (echo, CT, nuclear med)  3rd Floor: - Vacant  4th Floor: - TCTS (cardiothoracic surgery) - AFib Clinic -  Structural Heart Clinic - Vascular Surgery  - Vascular Ultrasound  5th Floor: - HeartCare Cardiology (general and EP) - Clinical Pharmacy for coumadin, hypertension, lipid, weight-loss medications, and med management appointments    Valet parking services will be available as well.

## 2023-05-02 DIAGNOSIS — M546 Pain in thoracic spine: Secondary | ICD-10-CM | POA: Diagnosis not present

## 2023-05-06 DIAGNOSIS — M546 Pain in thoracic spine: Secondary | ICD-10-CM | POA: Diagnosis not present

## 2023-05-09 DIAGNOSIS — M546 Pain in thoracic spine: Secondary | ICD-10-CM | POA: Diagnosis not present

## 2023-05-11 DIAGNOSIS — M546 Pain in thoracic spine: Secondary | ICD-10-CM | POA: Diagnosis not present

## 2023-05-23 DIAGNOSIS — M546 Pain in thoracic spine: Secondary | ICD-10-CM | POA: Diagnosis not present

## 2023-05-26 DIAGNOSIS — M546 Pain in thoracic spine: Secondary | ICD-10-CM | POA: Diagnosis not present

## 2023-06-01 DIAGNOSIS — M546 Pain in thoracic spine: Secondary | ICD-10-CM | POA: Diagnosis not present

## 2023-06-09 DIAGNOSIS — M546 Pain in thoracic spine: Secondary | ICD-10-CM | POA: Diagnosis not present

## 2023-06-15 DIAGNOSIS — M546 Pain in thoracic spine: Secondary | ICD-10-CM | POA: Diagnosis not present

## 2023-06-22 DIAGNOSIS — M546 Pain in thoracic spine: Secondary | ICD-10-CM | POA: Diagnosis not present

## 2023-06-29 DIAGNOSIS — M546 Pain in thoracic spine: Secondary | ICD-10-CM | POA: Diagnosis not present

## 2023-07-06 DIAGNOSIS — M546 Pain in thoracic spine: Secondary | ICD-10-CM | POA: Diagnosis not present

## 2023-09-14 ENCOUNTER — Other Ambulatory Visit: Payer: Self-pay | Admitting: Cardiology

## 2023-09-14 DIAGNOSIS — I480 Paroxysmal atrial fibrillation: Secondary | ICD-10-CM

## 2023-09-14 NOTE — Telephone Encounter (Signed)
 Prescription refill request for Eliquis  received. Indication:afib Last office visit:3/25 Scr:1.16  2024 Age: 73 Weight:64.1  kg  Prescription refilled

## 2023-11-10 DIAGNOSIS — I480 Paroxysmal atrial fibrillation: Secondary | ICD-10-CM | POA: Diagnosis not present

## 2023-11-10 DIAGNOSIS — E039 Hypothyroidism, unspecified: Secondary | ICD-10-CM | POA: Diagnosis not present

## 2023-11-10 DIAGNOSIS — E785 Hyperlipidemia, unspecified: Secondary | ICD-10-CM | POA: Diagnosis not present

## 2023-11-10 DIAGNOSIS — I10 Essential (primary) hypertension: Secondary | ICD-10-CM | POA: Diagnosis not present

## 2024-01-16 DIAGNOSIS — R7309 Other abnormal glucose: Secondary | ICD-10-CM | POA: Diagnosis not present

## 2024-01-16 DIAGNOSIS — Z0000 Encounter for general adult medical examination without abnormal findings: Secondary | ICD-10-CM | POA: Diagnosis not present

## 2024-01-16 DIAGNOSIS — Z1159 Encounter for screening for other viral diseases: Secondary | ICD-10-CM | POA: Diagnosis not present

## 2024-01-16 DIAGNOSIS — I10 Essential (primary) hypertension: Secondary | ICD-10-CM | POA: Diagnosis not present

## 2024-01-16 DIAGNOSIS — Z8673 Personal history of transient ischemic attack (TIA), and cerebral infarction without residual deficits: Secondary | ICD-10-CM | POA: Diagnosis not present

## 2024-01-16 DIAGNOSIS — R946 Abnormal results of thyroid function studies: Secondary | ICD-10-CM | POA: Diagnosis not present

## 2024-01-16 DIAGNOSIS — Z23 Encounter for immunization: Secondary | ICD-10-CM | POA: Diagnosis not present

## 2024-01-16 DIAGNOSIS — R350 Frequency of micturition: Secondary | ICD-10-CM | POA: Diagnosis not present

## 2024-01-16 DIAGNOSIS — I4891 Unspecified atrial fibrillation: Secondary | ICD-10-CM | POA: Diagnosis not present

## 2024-01-16 DIAGNOSIS — E785 Hyperlipidemia, unspecified: Secondary | ICD-10-CM | POA: Diagnosis not present

## 2024-01-19 DIAGNOSIS — Z6826 Body mass index (BMI) 26.0-26.9, adult: Secondary | ICD-10-CM | POA: Diagnosis not present

## 2024-01-19 DIAGNOSIS — J029 Acute pharyngitis, unspecified: Secondary | ICD-10-CM | POA: Diagnosis not present

## 2024-01-19 DIAGNOSIS — H6501 Acute serous otitis media, right ear: Secondary | ICD-10-CM | POA: Diagnosis not present

## 2024-03-14 ENCOUNTER — Other Ambulatory Visit: Payer: Self-pay

## 2024-03-14 DIAGNOSIS — I480 Paroxysmal atrial fibrillation: Secondary | ICD-10-CM

## 2024-03-14 MED ORDER — APIXABAN 5 MG PO TABS: 5.0000 mg | ORAL_TABLET | Freq: Two times a day (BID) | ORAL | 1 refills | Status: AC
